# Patient Record
Sex: Female | Born: 1940 | Race: White | Hispanic: No | Marital: Married | Smoking: Former smoker
Health system: Southern US, Community
[De-identification: ages and names within clinical notes are randomized; demographics above are authoritative.]

## PROBLEM LIST (undated history)

## (undated) DIAGNOSIS — E785 Hyperlipidemia, unspecified: Secondary | ICD-10-CM

## (undated) DIAGNOSIS — Z9289 Personal history of other medical treatment: Secondary | ICD-10-CM

## (undated) DIAGNOSIS — I519 Heart disease, unspecified: Secondary | ICD-10-CM

## (undated) DIAGNOSIS — I359 Nonrheumatic aortic valve disorder, unspecified: Secondary | ICD-10-CM

## (undated) DIAGNOSIS — Z0000 Encounter for general adult medical examination without abnormal findings: Secondary | ICD-10-CM

## (undated) DIAGNOSIS — G459 Transient cerebral ischemic attack, unspecified: Principal | ICD-10-CM

## (undated) DIAGNOSIS — J449 Chronic obstructive pulmonary disease, unspecified: Secondary | ICD-10-CM

## (undated) DIAGNOSIS — I10 Essential (primary) hypertension: Secondary | ICD-10-CM

## (undated) DIAGNOSIS — R001 Bradycardia, unspecified: Secondary | ICD-10-CM

## (undated) DIAGNOSIS — I480 Paroxysmal atrial fibrillation: Secondary | ICD-10-CM

## (undated) DIAGNOSIS — H269 Unspecified cataract: Secondary | ICD-10-CM

## (undated) DIAGNOSIS — D649 Anemia, unspecified: Secondary | ICD-10-CM

## (undated) DIAGNOSIS — H3530 Unspecified macular degeneration: Secondary | ICD-10-CM

## (undated) DIAGNOSIS — M545 Low back pain: Secondary | ICD-10-CM

## (undated) DIAGNOSIS — J019 Acute sinusitis, unspecified: Secondary | ICD-10-CM

## (undated) DIAGNOSIS — M25552 Pain in left hip: Secondary | ICD-10-CM

## (undated) DIAGNOSIS — Z7901 Long term (current) use of anticoagulants: Secondary | ICD-10-CM

## (undated) DIAGNOSIS — R1013 Epigastric pain: Secondary | ICD-10-CM

## (undated) DIAGNOSIS — G5760 Lesion of plantar nerve, unspecified lower limb: Secondary | ICD-10-CM

## (undated) DIAGNOSIS — R002 Palpitations: Secondary | ICD-10-CM

## (undated) HISTORY — DX: Epigastric pain: R10.13

## (undated) HISTORY — DX: Palpitations: R00.2

## (undated) HISTORY — DX: Nonrheumatic aortic valve disorder, unspecified: I35.9

## (undated) HISTORY — DX: Pain in left hip: M25.552

## (undated) HISTORY — DX: Encounter for general adult medical examination without abnormal findings: Z00.00

## (undated) HISTORY — DX: Bradycardia, unspecified: R00.1

## (undated) HISTORY — DX: Personal history of other medical treatment: Z92.89

## (undated) HISTORY — DX: Paroxysmal atrial fibrillation: I48.0

## (undated) HISTORY — DX: Essential (primary) hypertension: I10

## (undated) HISTORY — PX: ECTOPIC PREGNANCY SURGERY: SHX613

## (undated) HISTORY — PX: OOPHORECTOMY: SHX86

## (undated) HISTORY — DX: Hyperlipidemia, unspecified: E78.5

## (undated) HISTORY — PX: APPENDECTOMY: SHX54

## (undated) HISTORY — DX: Unspecified macular degeneration: H35.30

## (undated) HISTORY — DX: Lesion of plantar nerve, unspecified lower limb: G57.60

## (undated) HISTORY — DX: Chronic obstructive pulmonary disease, unspecified: J44.9

## (undated) HISTORY — DX: Transient cerebral ischemic attack, unspecified: G45.9

## (undated) HISTORY — DX: Long term (current) use of anticoagulants: Z79.01

## (undated) HISTORY — DX: Low back pain: M54.5

## (undated) HISTORY — DX: Anemia, unspecified: D64.9

## (undated) HISTORY — DX: Acute sinusitis, unspecified: J01.90

## (undated) HISTORY — DX: Unspecified cataract: H26.9

## (undated) HISTORY — DX: Heart disease, unspecified: I51.9

---

## 1998-11-01 ENCOUNTER — Emergency Department (HOSPITAL_COMMUNITY): Admission: EM | Admit: 1998-11-01 | Discharge: 1998-11-01 | Payer: Self-pay | Admitting: Emergency Medicine

## 1999-12-14 ENCOUNTER — Emergency Department (HOSPITAL_COMMUNITY): Admission: EM | Admit: 1999-12-14 | Discharge: 1999-12-15 | Payer: Self-pay | Admitting: *Deleted

## 2001-12-02 ENCOUNTER — Emergency Department (HOSPITAL_COMMUNITY): Admission: EM | Admit: 2001-12-02 | Discharge: 2001-12-02 | Payer: Self-pay | Admitting: Emergency Medicine

## 2005-06-22 ENCOUNTER — Emergency Department (HOSPITAL_COMMUNITY): Admission: EM | Admit: 2005-06-22 | Discharge: 2005-06-23 | Payer: Self-pay | Admitting: Emergency Medicine

## 2006-12-04 ENCOUNTER — Emergency Department (HOSPITAL_COMMUNITY): Admission: EM | Admit: 2006-12-04 | Discharge: 2006-12-04 | Payer: Self-pay | Admitting: Emergency Medicine

## 2006-12-11 ENCOUNTER — Emergency Department (HOSPITAL_COMMUNITY): Admission: EM | Admit: 2006-12-11 | Discharge: 2006-12-11 | Payer: Self-pay | Admitting: Family Medicine

## 2007-12-21 ENCOUNTER — Emergency Department (HOSPITAL_COMMUNITY): Admission: EM | Admit: 2007-12-21 | Discharge: 2007-12-21 | Payer: Self-pay | Admitting: Family Medicine

## 2008-11-24 ENCOUNTER — Emergency Department (HOSPITAL_COMMUNITY): Admission: EM | Admit: 2008-11-24 | Discharge: 2008-11-24 | Payer: Self-pay | Admitting: Family Medicine

## 2009-01-21 ENCOUNTER — Emergency Department (HOSPITAL_COMMUNITY): Admission: EM | Admit: 2009-01-21 | Discharge: 2009-01-21 | Payer: Self-pay | Admitting: Emergency Medicine

## 2009-01-26 DIAGNOSIS — Z9289 Personal history of other medical treatment: Secondary | ICD-10-CM

## 2009-01-26 HISTORY — DX: Personal history of other medical treatment: Z92.89

## 2010-01-16 ENCOUNTER — Ambulatory Visit: Payer: Self-pay | Admitting: Family Medicine

## 2010-01-16 DIAGNOSIS — Z8619 Personal history of other infectious and parasitic diseases: Secondary | ICD-10-CM | POA: Insufficient documentation

## 2010-01-16 DIAGNOSIS — E782 Mixed hyperlipidemia: Secondary | ICD-10-CM

## 2010-01-16 DIAGNOSIS — Z9189 Other specified personal risk factors, not elsewhere classified: Secondary | ICD-10-CM | POA: Insufficient documentation

## 2010-01-16 DIAGNOSIS — E663 Overweight: Secondary | ICD-10-CM | POA: Insufficient documentation

## 2010-01-16 DIAGNOSIS — I4891 Unspecified atrial fibrillation: Secondary | ICD-10-CM | POA: Insufficient documentation

## 2010-01-19 ENCOUNTER — Ambulatory Visit: Payer: Self-pay | Admitting: Family Medicine

## 2010-01-20 ENCOUNTER — Ambulatory Visit: Payer: Self-pay | Admitting: Cardiovascular Disease

## 2010-01-22 ENCOUNTER — Emergency Department (HOSPITAL_COMMUNITY): Admission: EM | Admit: 2010-01-22 | Discharge: 2010-01-22 | Payer: Self-pay | Admitting: Emergency Medicine

## 2010-01-27 ENCOUNTER — Ambulatory Visit: Payer: Self-pay | Admitting: Cardiology

## 2010-02-07 ENCOUNTER — Ambulatory Visit: Payer: Self-pay | Admitting: Cardiology

## 2010-03-08 ENCOUNTER — Ambulatory Visit: Payer: Self-pay | Admitting: Cardiology

## 2010-04-19 ENCOUNTER — Ambulatory Visit: Payer: Self-pay | Admitting: Cardiology

## 2010-04-21 ENCOUNTER — Ambulatory Visit: Payer: Self-pay | Admitting: Cardiology

## 2010-06-06 NOTE — Assessment & Plan Note (Signed)
Summary: AF/heart racing?dm   Vital Signs:  Patient profile:   70 year old female Weight:      185 pounds Temp:     986 degrees F oral BP sitting:   140 / 80  (left arm) Cuff size:   regular  Vitals Entered By: Sid Falcon LPN (January 19, 2010 3:01 PM)  History of Present Illness: Patient is seen as a work in with sensation of irregularity of heart rate over the past several days. She has history of known intermittent atrial fibrillation. She takes metoprolol regularly. She's noticed that her symptoms seem to be worse after eating. Earlier today after she had some grapefruit juice and had some epigastric burning and that seemed to trigger symptoms. She exercises regularly and walked briskly for 2 miles earlier today without any problem. No recent chest pain. No significant caffeine use.  NO RUQ pain. She has been followed by cardiologist for her intermittent A fib.  Symptoms seem to be relieved with burping and Improved with walking and movement. Patient has no history of coronary artery disease. Nonsmoker.  Allergies (verified): No Known Drug Allergies  Past History:  Past Surgical History: Last updated: 24-Jan-2010 h/o tubal pregnancies b/l, both tubes excised and 1 ovary excised Appendectomy  Family History: Last updated: 01/24/10 Father: deceased@81 , bladder cancer Mother: deceased@77 , RA, acute renal failure, HTN Siblings:  Brother: 48, cardiac dysrthymia, severe sleep apnea Sister: deceased@38 , breast cancer, hematologic complication of chemo MGM: unknown hx for all, older age MGF: PGM: PGF: Children: Daughter: 7, A&W  Social History: Last updated: 2010/01/24 Uses Seat Belt Occupation: worked at [employer] Retired Married 52 years Former Smoker quit over 30 years ago, never a heavy smoker Alcohol use-no, stopped w/afib dx, never heavy or frequent Drug use-no Regular exercise-yes, [facility] and [facility] Diet, no restrictions, minimizes dairy  Risk  Factors: Exercise: yes (2010/01/24)  Risk Factors: Smoking Status: quit (2010/01/24)  Past Medical History: A fib PMH-FH-SH reviewed for relevance  Review of Systems  The patient denies anorexia, fever, weight loss, chest pain, syncope, dyspnea on exertion, peripheral edema, prolonged cough, headaches, hemoptysis, melena, and hematochezia.    Physical Exam  General:  Well-developed,well-nourished,in no acute distress; alert,appropriate and cooperative throughout examination Mouth:  Oral mucosa and oropharynx without lesions or exudates.  Teeth in good repair. Neck:  No deformities, masses, or tenderness noted. Lungs:  Normal respiratory effort, chest expands symmetrically. Lungs are clear to auscultation, no crackles or wheezes. Heart:  Normal rate and regular rhythm. S1 and S2 normal without gallop, murmur, click, rub or other extra sounds. Abdomen:  soft, non-tender, normal bowel sounds, no distention, no masses, no guarding, no rigidity, no hepatomegaly, and no splenomegaly.   Neurologic:  no edema   Impression & Recommendations:  Problem # 1:  ATRIAL FIBRILLATION (ICD-427.31) EKG sinus rhythm.  Cont metoprolol. Her updated medication list for this problem includes:    Metoprolol Tartrate 50 Mg Tabs (Metoprolol tartrate) .Marland Kitchen... As needed    Metoprolol Succinate 50 Mg Xr24h-tab (Metoprolol succinate) ..... Once daily    Aspirin 81 Mg Tbec (Aspirin) ..... Once daily  Problem # 2:  ABDOMINAL PAIN, EPIGASTRIC (ICD-789.06)  suspect related to some reflux. Samples of AcipHex 20 mg one daily. Consider Holter monitor if symptoms persist Avoid acidic and other aggravating foods.  Orders: EKG w/ Interpretation (93000)  Complete Medication List: 1)  Metoprolol Tartrate 50 Mg Tabs (Metoprolol tartrate) .... As needed 2)  Metoprolol Succinate 50 Mg Xr24h-tab (Metoprolol succinate) .... Once  daily 3)  Aspirin 81 Mg Tbec (Aspirin) .... Once daily 4)  Calcium  .... Once daily 5)   Multivitamin  .... Once daily  Patient Instructions: 1)  Start Aciphex 20 mg one by mouth daily. 2)  Be in touch with Dr Rogelia Rohrer if symptoms persist into next week.

## 2010-06-06 NOTE — Assessment & Plan Note (Signed)
Summary: BRAND NEW PT/TO EST/CJR   Vital Signs:  Patient profile:   70 year old female Height:      65 inches (165.10 cm) Weight:      185 pounds (84.09 kg) BMI:     30.90 O2 Sat:      98 % on Room air Temp:     98.3 degrees F (36.83 degrees C) oral Pulse rate:   56 / minute BP sitting:   128 / 86  (left arm) Cuff size:   regular  Vitals Entered By: Josph Macho RMA (January 16, 2010 9:18 AM)  O2 Flow:  Room air CC: Establish new patient/ CF Is Patient Diabetic? No   History of Present Illness: Patient in today for new patient appointment doing very well. No recent illness/f/c/malaise/CP/palp/SOB/GU c/o. Has not had a PMD in past sees Dr Hyacinth Meeker of OB/GYN and Dr Cassell Clement of cardiology does her labs q 6 months and manages her intermittent Afib. She has been symptomatic in past but not recently.  Preventive Screening-Counseling & Management  Alcohol-Tobacco     Smoking Status: quit  Caffeine-Diet-Exercise     Does Patient Exercise: yes      Drug Use:  no.    Current Medications (verified): 1)  Metoprolol Tartrate 50 Mg Tabs (Metoprolol Tartrate) .... As Needed 2)  Metoprolol Succinate 50 Mg Xr24h-Tab (Metoprolol Succinate) .... Once Daily 3)  Aspirin 81 Mg Tbec (Aspirin) .... Once Daily 4)  Calcium .... Once Daily 5)  Multivitamin .... 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 52 years Former Smoker quit over 30 years ago,  never a heavy smoker Alcohol use-no, stopped w/afib dx, never heavy or frequent Drug use-no Regular exercise-yes, [facility] and [facility] Diet, no restrictions, minimizes dairy Occupation:  employed Smoking Status:  quit Drug Use:  no Does Patient Exercise:  yes  Review of Systems  The patient denies anorexia, fever, weight loss, weight gain, vision loss, decreased hearing, hoarseness, chest pain, syncope, dyspnea on exertion, peripheral edema, prolonged cough, headaches, hemoptysis, abdominal pain, melena, hematochezia, severe indigestion/heartburn, hematuria, incontinence, muscle weakness, suspicious skin lesions, transient blindness, difficulty walking, depression, unusual weight change, abnormal bleeding, and enlarged lymph nodes.    Physical Exam  General:  Well-developed,well-nourished,in no acute distress; alert,appropriate and cooperative throughout examination Head:  Normocephalic and atraumatic without obvious abnormalities. No apparent alopecia or balding. Eyes:  No corneal or conjunctival inflammation noted. EOMI.  Ears:  External ear exam shows no significant lesions or deformities.  Otoscopic examination reveals clear canals, tympanic membranes are intact bilaterally without bulging, retraction, inflammation or discharge. Hearing is grossly normal bilaterally. Nose:  External nasal examination shows no deformity or inflammation. Nasal mucosa are pink and moist without lesions or exudates. Mouth:  Oral mucosa and oropharynx without lesions or exudates.  Teeth in good repair. Neck:  No deformities, masses, or tenderness noted. Lungs:  Normal respiratory effort, chest expands symmetrically. Lungs are clear to auscultation, no crackles or wheezes. Heart:  Normal regular rhythm. S1 and S2 normal without gallop ,  click, rub or other extra sounds.grade  2/6 systolic murmur.  Mild bradycardia Abdomen:  Bowel sounds positive,abdomen soft and non-tender without masses, organomegaly or  hernias noted. Msk:  No deformity or scoliosis noted of thoracic or lumbar spine.   Pulses:  R and L carotid,radial,femoral,dorsalis pedis and posterior tibial pulses are full and equal bilaterally Extremities:  No clubbing, cyanosis, edema, or deformity noted with normal full range of motion of all joints.   Neurologic:  No cranial nerve deficits noted. Station and gait are normal. Plantar reflexes are down-going bilaterally. DTRs are symmetrical throughout. Sensory, motor and coordinative functions appear intact. Skin:  Intact without suspicious lesions or rashes. small scaly scar posterior, upper calf roughly 1 cm, flesh colored Cervical Nodes:  No lymphadenopathy noted Psych:  Cognition and judgment appear intact. Alert and cooperative with normal attention span and concentration. No apparent delusions, illusions, hallucinations   Impression & Recommendations:  Problem # 1:  MIXED HYPERLIPIDEMIA (ICD-272.2) Mild, avoid trans fats. Add Benefiber or Metamucil daily and cont to check labs and have them forwarded q 6 months.  Problem # 2:  OVERWEIGHT (ICD-278.02) Continue exercise regimen and monitor  Problem # 3:  ATRIAL FIBRILLATION (ICD-427.31)  Her updated medication list for this problem includes:    Metoprolol Tartrate 50 Mg Tabs (Metoprolol tartrate) .Marland Kitchen... As needed    Metoprolol Succinate 50 Mg Xr24h-tab (Metoprolol succinate) ..... Once daily    Aspirin 81 Mg Tbec (Aspirin) ..... Once daily Intermittent, f/u with Dr Patty Sermons  Problem # 4:  Preventive Health Care (ICD-V70.0) Declines referral to GI for colonoscopy, she says she will set it up herself with Dr Ewing Schlein at [facility] She also declines flu shot says she will get it at [facility] in October  Complete Medication List: 1)  Metoprolol Tartrate 50 Mg Tabs (Metoprolol tartrate) .... As needed 2)  Metoprolol Succinate 50 Mg Xr24h-tab (Metoprolol succinate) .... Once daily 3)  Aspirin 81 Mg Tbec (Aspirin) .... Once  daily 4)  Calcium  .... Once daily 5)  Multivitamin  .... Once daily  Patient Instructions: 1)  Please schedule a follow-up appointment in 1 year for annual exam 2)  Schedule yourself a screening Colonoscopy with Dr Ewing Schlein 3)  Add Benefiber 2 tsp by mouth once daily in 6-8 oz of fluids daily  Preventive Care Screening  Hemoccult:    Date:  10/27/2009    Results:  historical   Pap Smear:    Date:  10/27/2009    Results:  historical   Mammogram:    Date:  07/05/2009    Results:  historical   Last Flu Shot:    Date:  02/04/2009    Results:  historicall   Last Pneumovax:    Date:  05/08/2007    Results:  historical   Last Tetanus Booster:    Date:  05/08/2007    Results:  Historical

## 2010-07-20 LAB — POCT I-STAT, CHEM 8
BUN: 12 mg/dL (ref 6–23)
Chloride: 107 mEq/L (ref 96–112)
Creatinine, Ser: 1.1 mg/dL (ref 0.4–1.2)
Sodium: 141 mEq/L (ref 135–145)
TCO2: 25 mmol/L (ref 0–100)

## 2010-07-20 LAB — POCT CARDIAC MARKERS
CKMB, poc: <1 ng/mL — ABNORMAL LOW (ref 1.0–8.0)
Myoglobin, poc: 74.3 ng/mL (ref 12–200)
Troponin i, poc: <0.05 ng/mL (ref 0.00–0.09)

## 2010-07-27 ENCOUNTER — Other Ambulatory Visit: Payer: Self-pay | Admitting: *Deleted

## 2010-07-27 DIAGNOSIS — K30 Functional dyspepsia: Secondary | ICD-10-CM

## 2010-07-27 MED ORDER — OMEPRAZOLE 40 MG PO CPDR: 40.0000 mg | DELAYED_RELEASE_CAPSULE | Freq: Every day | ORAL | Status: DC

## 2010-07-27 NOTE — Telephone Encounter (Signed)
Refilled meds per fax request.  

## 2010-07-31 ENCOUNTER — Encounter: Payer: Self-pay | Admitting: *Deleted

## 2010-07-31 NOTE — Telephone Encounter (Signed)
Prescription requested via pharmacy fax again, opened encounter and refill already done.

## 2010-08-02 ENCOUNTER — Encounter: Payer: Self-pay | Admitting: Family Medicine

## 2010-08-11 LAB — BASIC METABOLIC PANEL
BUN: 12 mg/dL (ref 6–23)
CO2: 28 mEq/L (ref 19–32)
Calcium: 9.4 mg/dL (ref 8.4–10.5)
GFR calc non Af Amer: >60 mL/min (ref >60)
Glucose, Bld: 105 mg/dL — ABNORMAL HIGH (ref 70–99)
Sodium: 143 mEq/L (ref 135–145)

## 2010-08-11 LAB — CBC
HCT: 39.8 % (ref 36.0–46.0)
Hemoglobin: 13.5 g/dL (ref 12.0–15.0)
MCHC: 33.8 g/dL (ref 30.0–36.0)
Platelets: 256 10*3/uL (ref 150–400)
RDW: 13.1 % (ref 11.5–15.5)

## 2010-08-11 LAB — URINALYSIS, ROUTINE W REFLEX MICROSCOPIC
Glucose, UA: NEGATIVE mg/dL
Ketones, ur: NEGATIVE mg/dL
Nitrite: NEGATIVE
Protein, ur: NEGATIVE mg/dL
Urobilinogen, UA: 0.2 mg/dL (ref 0.0–1.0)

## 2010-08-11 LAB — DIFFERENTIAL
Basophils Absolute: 0 10*3/uL (ref 0.0–0.1)
Basophils Relative: 1 % (ref 0–1)
Eosinophils Relative: 3 % (ref 0–5)
Monocytes Absolute: 0.6 10*3/uL (ref 0.1–1.0)

## 2010-08-11 LAB — URINE CULTURE: Colony Count: 10000

## 2010-08-11 LAB — URINE MICROSCOPIC-ADD ON

## 2010-08-11 LAB — POCT CARDIAC MARKERS

## 2010-08-13 LAB — URINE CULTURE

## 2010-08-13 LAB — POCT URINALYSIS DIP (DEVICE)
Ketones, ur: NEGATIVE mg/dL
Protein, ur: NEGATIVE mg/dL
Specific Gravity, Urine: 1.01 (ref 1.005–1.030)
Urobilinogen, UA: 0.2 mg/dL (ref 0.0–1.0)
pH: 7 (ref 5.0–8.0)

## 2010-10-16 ENCOUNTER — Other Ambulatory Visit: Payer: Self-pay | Admitting: *Deleted

## 2010-10-16 DIAGNOSIS — E785 Hyperlipidemia, unspecified: Secondary | ICD-10-CM

## 2010-10-17 ENCOUNTER — Encounter: Payer: Self-pay | Admitting: *Deleted

## 2010-10-17 ENCOUNTER — Other Ambulatory Visit: Payer: Self-pay | Admitting: Cardiology

## 2010-10-17 ENCOUNTER — Other Ambulatory Visit (INDEPENDENT_AMBULATORY_CARE_PROVIDER_SITE_OTHER): Payer: Medicare Other | Admitting: *Deleted

## 2010-10-17 DIAGNOSIS — E785 Hyperlipidemia, unspecified: Secondary | ICD-10-CM

## 2010-10-17 LAB — HEPATIC FUNCTION PANEL
AST: 25 U/L (ref 0–37)
Alkaline Phosphatase: 85 U/L (ref 39–117)
Bilirubin, Direct: 0.1 mg/dL (ref 0.0–0.3)
Total Bilirubin: 0.6 mg/dL (ref 0.3–1.2)

## 2010-10-17 LAB — LIPID PANEL
Cholesterol: 225 mg/dL — ABNORMAL HIGH (ref 0–200)
Total CHOL/HDL Ratio: 4
VLDL: 13.4 mg/dL (ref 0.0–40.0)

## 2010-10-17 LAB — LDL CHOLESTEROL, DIRECT: Direct LDL: 156.6 mg/dL

## 2010-10-17 LAB — BASIC METABOLIC PANEL
BUN: 14 mg/dL (ref 6–23)
GFR: 58.55 mL/min — ABNORMAL LOW (ref >60.00)
Potassium: 4.2 mEq/L (ref 3.5–5.1)
Sodium: 139 mEq/L (ref 135–145)

## 2010-10-18 ENCOUNTER — Encounter: Payer: Self-pay | Admitting: Cardiology

## 2010-10-18 ENCOUNTER — Ambulatory Visit (INDEPENDENT_AMBULATORY_CARE_PROVIDER_SITE_OTHER): Payer: Medicare Other | Admitting: Cardiology

## 2010-10-18 VITALS — BP 130/72 | HR 56 | Wt 176.0 lb

## 2010-10-18 DIAGNOSIS — I4891 Unspecified atrial fibrillation: Secondary | ICD-10-CM

## 2010-10-18 DIAGNOSIS — E782 Mixed hyperlipidemia: Secondary | ICD-10-CM

## 2010-10-18 DIAGNOSIS — E7800 Pure hypercholesterolemia, unspecified: Secondary | ICD-10-CM

## 2010-10-18 DIAGNOSIS — Z79899 Other long term (current) drug therapy: Secondary | ICD-10-CM

## 2010-10-18 MED ORDER — METOPROLOL TARTRATE 50 MG PO TABS: 50.0000 mg | ORAL_TABLET | ORAL | Status: DC | PRN

## 2010-10-18 MED ORDER — SIMVASTATIN 20 MG PO TABS: 20.0000 mg | ORAL_TABLET | Freq: Every evening | ORAL | Status: DC

## 2010-10-18 NOTE — Assessment & Plan Note (Signed)
The patient has a past history of paroxysmal atrial fibrillation.  Since the addition of a small dose of Lanoxin 0.125 mg daily the patient has had fewer episodes of atrial fib.  Number the episodes last for a long period she has not had to be on long-term Coumadin.  She is taking a daily aspirin.  She does not have any history of ischemic heart disease.

## 2010-10-18 NOTE — Assessment & Plan Note (Signed)
The patient has a past history of hyperlipidemia.  She has not been able to bring her cholesterol down with diet and exercise.  Her LDL today is156.The patient is now willing to try statin drugs which she has been unwilling to previously Try.  Cost is a factor so we will start with simvastatin 20 mg daily

## 2010-10-18 NOTE — Progress Notes (Signed)
Dagoberto Reef Date of Birth:  Sep 16, 1940 [facility] / [facility] [address]   [address] [place], [place]  [address] [phone]           Fax   [phone]  History of Present Illness: This pleasant middle-aged Caucasian female is seen for a followup office visit.  She has a history of paroxysmal atrial fibrillation and history of hypercholesterolemia.  She has not had any sustained atrial fibrillation since last visit.  Her symptoms of atrial fibrillation have decreased since starting Lanoxin and continuing Toprol.  He does not have any history of ischemic heart disease.  She does have a history of high cholesterol and her LDL this time he is 156 the patient has a history of a echocardiogram on 01/26/09 showing normal left ventricular systolic function with an ejection fraction of 55-60%, normal diastolic function, mild aortic sclerosis with mild aortic insufficiency, and mild mitral regurgitation.  Current Outpatient Prescriptions  Medication Sig Dispense Refill  . aspirin 81 MG tablet Take 81 mg by mouth daily.        Marland Kitchen CALCIUM PO Take 1 tablet by mouth daily.        . digoxin (LANOXIN) 0.125 MG tablet Take 125 mcg by mouth daily.        . metoprolol (LOPRESSOR) 50 MG tablet Take 1 tablet (50 mg total) by mouth as needed.  30 tablet  12  . metoprolol (TOPROL-XL) 50 MG 24 hr tablet Take 50 mg by mouth daily.        . multivitamin (THERAGRAN) per tablet Take 1 tablet by mouth daily.        Marland Kitchen omeprazole (PRILOSEC) 40 MG capsule Take 1 capsule (40 mg total) by mouth daily.  30 capsule  11  . DISCONTD: metoprolol (LOPRESSOR) 50 MG tablet Take 50 mg by mouth as needed.        . simvastatin (ZOCOR) 20 MG tablet Take 1 tablet (20 mg total) by mouth every evening.  30 tablet  11    No Known Allergies  Patient Active Problem List  Diagnoses  . Mixed hyperlipidemia  . OVERWEIGHT  . Atrial fibrillation  . MEASLES, HX OF  . CHICKENPOX, HX OF    History  Smoking  status  . Former Smoker -- 0.5 packs/day for 25 years  . Types: Cigarettes  . Quit date: 06/22/2005  Smokeless tobacco  . Not on file    History  Alcohol Use No    Family History  Problem Relation Age of Onset  . Kidney failure Mother   . Hypertension Mother   . Stroke Mother   . Cancer Father     bladder cancer  . Atrial fibrillation Brother   . Sleep apnea Brother   . Hypertension Brother   . Breast cancer Sister     Review of Systems: Constitutional: no fever chills diaphoresis or fatigue or change in weight.  Head and neck: no hearing loss, no epistaxis, no photophobia or visual disturbance. Respiratory: No cough, shortness of breath or wheezing. Cardiovascular: No chest pain peripheral edema, palpitations. Gastrointestinal: No abdominal distention, no abdominal pain, no change in bowel habits hematochezia or melena. Genitourinary: No dysuria, no frequency, no urgency, no nocturia. Musculoskeletal:No arthralgias, no back pain, no gait disturbance or myalgias. Neurological: No dizziness, no headaches, no numbness, no seizures, no syncope, no weakness, no tremors. Hematologic: No lymphadenopathy, no easy bruising. Psychiatric: No confusion, no hallucinations, no sleep disturbance.    Physical Exam: Filed Vitals:  10/18/10 0945  BP: 130/72  Pulse: 56  The general appearance feels a well-developed well-nourished woman in no distress.The head and neck exam reveals pupils equal and reactive.  Extraocular movements are full.  There is no scleral icterus.  The mouth and pharynx are normal.  The neck is supple.  The carotids reveal no bruits.  The jugular venous pressure is normal.  The  thyroid is not enlarged.  There is no lymphadenopathy.  The chest is clear to percussion and auscultation.  There are no rales or rhonchi.  Expansion of the chest is symmetrical.  The precordium is quiet.  The first heart sound is normal.  The second heart sound is physiologically split.   There is no  gallop rub or click.There is a soft grade 1/6 systolic ejection murmur at the base.  There is no abnormal lift or heave.  The abdomen is soft and nontender.  The bowel sounds are normal.  The liver and spleen are not enlarged.  There are no abdominal masses.  There are no abdominal bruits.  Extremities reveal good pedal pulses.  There is no phlebitis or edema.  There is no cyanosis or clubbing.  Strength is normal and symmetrical in all extremities.  There is no lateralizing weakness.  There are no sensory deficits.  The skin is warm and dry.  There is no rash.   Assessment / Plan: Hypercholesterolemia.  She is to continue a low cholesterol diet and we're adding simvastatin 20 mg daily in the evening.  She will return in 2 months for fasting lab work alone and in 6 months for an office visit and fasting lab work

## 2010-12-27 ENCOUNTER — Other Ambulatory Visit (INDEPENDENT_AMBULATORY_CARE_PROVIDER_SITE_OTHER): Payer: Medicare Other | Admitting: *Deleted

## 2010-12-27 ENCOUNTER — Other Ambulatory Visit: Payer: Medicare Other | Admitting: *Deleted

## 2010-12-27 DIAGNOSIS — Z79899 Other long term (current) drug therapy: Secondary | ICD-10-CM

## 2010-12-27 DIAGNOSIS — E7800 Pure hypercholesterolemia, unspecified: Secondary | ICD-10-CM

## 2010-12-27 LAB — LIPID PANEL
Total CHOL/HDL Ratio: 3
Triglycerides: 66 mg/dL (ref 0.0–149.0)

## 2010-12-28 ENCOUNTER — Telehealth: Payer: Self-pay | Admitting: *Deleted

## 2010-12-28 NOTE — Telephone Encounter (Signed)
Advised of labs 

## 2010-12-28 NOTE — Telephone Encounter (Signed)
Message copied by Eugenia Pancoast on Thu Dec 28, 2010 10:18 AM ------      Message from: Cassell Clement      Created: Thu Dec 28, 2010  9:00 AM       Lipids are much better.      CSD

## 2011-01-24 ENCOUNTER — Other Ambulatory Visit: Payer: Self-pay | Admitting: Cardiology

## 2011-01-24 DIAGNOSIS — I4891 Unspecified atrial fibrillation: Secondary | ICD-10-CM

## 2011-03-13 ENCOUNTER — Other Ambulatory Visit: Payer: Self-pay | Admitting: Cardiology

## 2011-03-13 MED ORDER — METOPROLOL SUCCINATE ER 50 MG PO TB24: 50.0000 mg | ORAL_TABLET | Freq: Every day | ORAL | Status: DC

## 2011-04-10 ENCOUNTER — Ambulatory Visit (INDEPENDENT_AMBULATORY_CARE_PROVIDER_SITE_OTHER): Payer: Medicare Other | Admitting: Family Medicine

## 2011-04-10 ENCOUNTER — Encounter: Payer: Self-pay | Admitting: Family Medicine

## 2011-04-10 VITALS — BP 131/73 | HR 62 | Temp 98.0°F | Ht 65.0 in | Wt 179.0 lb

## 2011-04-10 DIAGNOSIS — J329 Chronic sinusitis, unspecified: Secondary | ICD-10-CM

## 2011-04-10 DIAGNOSIS — J019 Acute sinusitis, unspecified: Secondary | ICD-10-CM

## 2011-04-10 DIAGNOSIS — R05 Cough: Secondary | ICD-10-CM

## 2011-04-10 HISTORY — DX: Acute sinusitis, unspecified: J01.90

## 2011-04-10 MED ORDER — AZITHROMYCIN 250 MG PO TABS: ORAL_TABLET | ORAL | Status: AC

## 2011-04-10 MED ORDER — GUAIFENESIN ER 600 MG PO TB12: 600.0000 mg | ORAL_TABLET | Freq: Two times a day (BID) | ORAL | Status: DC

## 2011-04-10 MED ORDER — CHLORPHENIRAMINE-HYDROCODONE 8-10 MG/5ML PO LQCR: 5.0000 mL | Freq: Two times a day (BID) | ORAL | Status: DC | PRN

## 2011-04-10 NOTE — Progress Notes (Signed)
Autumn Moss [id number] [birth year] 04/10/2011      Progress Note-Follow Up  Subjective  Chief Complaint  Chief Complaint  Patient presents with  . Cough    x 1 week, temp 99.6 Saturday,    HPI  Patient is a 70 year old Caucasian female who is in today with a roughly one-week history of worsening headache and cough. Her cough is productive of thick green phlegm at times. She has significant nasal congestion or rhinorrhea, sore throat, dysphasia and some hoarseness. She notes her husband recent similar symptoms was treated with antibiotics and did improve somewhat. No ear pain, chest pain, palpitations, shortness of breath  Past Medical History  Diagnosis Date  . Hypertension   . Paroxysmal atrial fibrillation   . Palpitations     sporadic  . Hyperlipidemia   . Aortic valve disease     mild - 07/05/05 per echocardiogram   . Left ventricular diastolic dysfunction     mild - 07/05/05 per echocardiogram  . COPD (chronic obstructive pulmonary disease)   . Ectopic pregnancy     history of x2  . Dyspepsia     occasional  . History of echocardiogram 01/26/2009    Est. EF of 55-60%  --  MILD MITRAL REGURGITATION  -  SINCE LAST ECHO OF 07/10/05 NO SIGNIFICANT CHANGES.  ---  Clovis Pu Patty Sermons, MD    Past Surgical History  Procedure Date  . Ectopic pregnancy surgery     bilateral, both tubes excised and 1 ovary excised  . Oophorectomy   . Appendectomy     Family History  Problem Relation Age of Onset  . Kidney failure Mother   . Hypertension Mother   . Stroke Mother   . Arthritis Mother     RA  . Cancer Father     bladder cancer  . Atrial fibrillation Brother   . Sleep apnea Brother   . Hypertension Brother   . Breast cancer Sister     History   Social History  . Marital Status: Married    Spouse Name: Merlyn Albert    Number of Children: 1  . Years of Education: N/A   Occupational History  .     Social History Main Topics  . Smoking status: Former Smoker -- 0.5  packs/day for 25 years    Types: Cigarettes    Quit date: 06/22/2005  . Smokeless tobacco: Never Used  . Alcohol Use: No  . Drug Use: No  . Sexually Active: Not on file   Other Topics Concern  . Not on file   Social History Narrative   Uses seat beltWorked at [employer], [employer] exercise: yes, goes to [facility] daily and walksDiet, no restrictions, minimizes dairy    Current Outpatient Prescriptions on File Prior to Visit  Medication Sig Dispense Refill  . aspirin 81 MG tablet Take 81 mg by mouth daily.        Marland Kitchen CALCIUM PO Take 1 tablet by mouth daily.        . digoxin (LANOXIN) 0.125 MG tablet TAKE 1 TABLET EVERY DAY  30 tablet  11  . metoprolol (LOPRESSOR) 50 MG tablet Take 1 tablet (50 mg total) by mouth as needed.  30 tablet  12  . metoprolol (TOPROL-XL) 50 MG 24 hr tablet Take 1 tablet (50 mg total) by mouth daily.  30 tablet  2  . multivitamin (THERAGRAN) per tablet Take 1 tablet by mouth daily.        Marland Kitchen omeprazole (PRILOSEC) 40 MG  capsule Take 1 capsule (40 mg total) by mouth daily.  30 capsule  11  . simvastatin (ZOCOR) 20 MG tablet Take 1 tablet (20 mg total) by mouth every evening.  30 tablet  11    No Known Allergies  Review of Systems  Review of Systems  Constitutional: Positive for fever and malaise/fatigue.  HENT: Positive for congestion and sore throat.   Eyes: Negative for discharge.  Respiratory: Positive for cough, sputum production and shortness of breath.   Cardiovascular: Negative for chest pain, palpitations and leg swelling.  Gastrointestinal: Negative for nausea, abdominal pain and diarrhea.  Genitourinary: Negative for dysuria.  Musculoskeletal: Negative for falls.  Skin: Negative for rash.  Neurological: Negative for loss of consciousness and headaches.  Endo/Heme/Allergies: Negative for polydipsia.  Psychiatric/Behavioral: Negative for depression and suicidal ideas. The patient is not nervous/anxious and does not have insomnia.      Objective  BP  131/73  Pulse 62  Temp(Src) 98 F (36.7 C) (Oral)  Ht 5\' 5"  (1.651 m)  Wt 179 lb (81.194 kg)  BMI 29.79 kg/m2  SpO2 97%  Physical Exam  Physical Exam  Constitutional: She is oriented to person, place, and time and well-developed, well-nourished, and in no distress. No distress.  HENT:  Head: Normocephalic and atraumatic.       Erythema in posterior oropharynx  Eyes: Conjunctivae are normal.  Neck: Neck supple. No thyromegaly present.  Cardiovascular: Normal rate, regular rhythm and normal heart sounds.   No murmur heard. Pulmonary/Chest: Effort normal and breath sounds normal. She has no wheezes.  Abdominal: She exhibits no distension and no mass.  Musculoskeletal: She exhibits no edema.  Lymphadenopathy:    She has no cervical adenopathy.  Neurological: She is alert and oriented to person, place, and time.  Skin: Skin is warm and dry. No rash noted. She is not diaphoretic.  Psychiatric: Memory, affect and judgment normal.    No results found for this basename: TSH   Lab Results  Component Value Date   WBC 5.7 01/21/2009   HGB 14.6 01/22/2010   HCT 43.0 01/22/2010   MCV 97.7 01/21/2009   PLT 256 01/21/2009   Lab Results  Component Value Date   CREATININE 1.0 10/17/2010   BUN 14 10/17/2010   NA 139 10/17/2010   K 4.2 10/17/2010   CL 105 10/17/2010   CO2 28 10/17/2010   Lab Results  Component Value Date   ALT 21 10/17/2010   AST 25 10/17/2010   ALKPHOS 85 10/17/2010   BILITOT 0.6 10/17/2010   Lab Results  Component Value Date   CHOL 150 12/27/2010   Lab Results  Component Value Date   HDL 52.70 12/27/2010   Lab Results  Component Value Date   LDLCALC 84 12/27/2010   Lab Results  Component Value Date   TRIG 66.0 12/27/2010   Lab Results  Component Value Date   CHOLHDL 3 12/27/2010     Assessment & Plan  Sinusitis acute Patient started on Zpack and Mucinex bid, increase fluids and given some Tussionex to help her get some rest

## 2011-04-10 NOTE — Assessment & Plan Note (Signed)
Patient started on Zpack and Mucinex bid, increase fluids and given some Tussionex to help her get some rest

## 2011-04-10 NOTE — Patient Instructions (Addendum)
Sinusitis Sinuses are air pockets within the bones of your face. The growth of bacteria within a sinus leads to infection. The infection prevents the sinuses from draining. This infection is called sinusitis. SYMPTOMS  There will be different areas of pain depending on which sinuses have become infected.  The maxillary sinuses often produce pain beneath the eyes.   Frontal sinusitis may cause pain in the middle of the forehead and above the eyes.  Other problems (symptoms) include:  Toothaches.   Colored, pus-like (purulent) drainage from the nose.   Swelling, warmth, and tenderness over the sinus areas may be signs of infection.  TREATMENT  Sinusitis is most often determined by an exam.X-rays may be taken. If x-rays have been taken, make sure you obtain your results or find out how you are to obtain them. Your caregiver may give you medications (antibiotics). These are medications that will help kill the bacteria causing the infection. You may also be given a medication (decongestant) that helps to reduce sinus swelling.  HOME CARE INSTRUCTIONS   Only take over-the-counter or prescription medicines for pain, discomfort, or fever as directed by your caregiver.   Drink extra fluids. Fluids help thin the mucus so your sinuses can drain more easily.   Applying either moist heat or ice packs to the sinus areas may help relieve discomfort.   Use saline nasal sprays to help moisten your sinuses. The sprays can be found at your local drugstore.  SEEK IMMEDIATE MEDICAL CARE IF:  You have a fever.   You have increasing pain, severe headaches, or toothache.   You have nausea, vomiting, or drowsiness.   You develop unusual swelling around the face or trouble seeing.  MAKE SURE YOU:   Understand these instructions.   Will watch your condition.   Will get help right away if you are not doing well or get worse.  Document Released: 04/23/2005 Document Revised: 01/03/2011 Document Reviewed:  11/20/2006 [facility] Patient Information 2012 [place], [place].  Increase fluids in your diet

## 2011-04-18 ENCOUNTER — Ambulatory Visit (INDEPENDENT_AMBULATORY_CARE_PROVIDER_SITE_OTHER): Payer: Medicare Other | Admitting: Family Medicine

## 2011-04-18 ENCOUNTER — Encounter: Payer: Self-pay | Admitting: Family Medicine

## 2011-04-18 VITALS — BP 145/74 | HR 58 | Temp 98.5°F | Ht 65.0 in | Wt 177.8 lb

## 2011-04-18 DIAGNOSIS — I4891 Unspecified atrial fibrillation: Secondary | ICD-10-CM

## 2011-04-18 DIAGNOSIS — J019 Acute sinusitis, unspecified: Secondary | ICD-10-CM

## 2011-04-18 DIAGNOSIS — J329 Chronic sinusitis, unspecified: Secondary | ICD-10-CM

## 2011-04-18 DIAGNOSIS — G5760 Lesion of plantar nerve, unspecified lower limb: Secondary | ICD-10-CM

## 2011-04-18 HISTORY — DX: Lesion of plantar nerve, unspecified lower limb: G57.60

## 2011-04-18 MED ORDER — AZITHROMYCIN 250 MG PO TABS: ORAL_TABLET | ORAL | Status: AC

## 2011-04-18 NOTE — Patient Instructions (Signed)
Morton's Neuroma Neuralgia (nerve pain) or neuroma (benign [non-cancerous] nerve tumor) may develop on any interdigital nerve. The interdigital nerves (nerves between digits) of the foot travel beneath and between the metatarsals (long bones of the fore foot) and pass the nerve endings to the toes. The third interdigital is a common place for a small neuroma to form called Morton's neuroma. Another nerve to be affected commonly is the fourth interdigital nerve. This would be in approximately in the area of the base or ball under the bottom of your fourth toe. This condition occurs more commonly in women and is usually on one side. It is usually first noticed by pain radiating (spreading) to the ball of the foot or to the toes. CAUSES The cause of interdigital neuralgia may be from low grade repetitive trauma (damage caused by an accident) as in activities causing a repeated pounding of the foot (running, jumping etc.). It is also caused by improper footwear or recent loss of the fatty padding on the bottom of the foot. TREATMENT  The condition often resolves (goes away) simply with decreasing activity if that is thought to be the cause. Proper shoes are beneficial. Orthotics (special foot support aids) such as a metatarsal bar are often beneficial. This condition usually responds to conservative therapy, however if surgery is necessary it usually brings complete relief. HOME CARE INSTRUCTIONS   Apply ice to the area of soreness for 15 to 20 minutes, 3 to 4 times per day, while awake for the first 2 days. Put ice in a plastic bag and place a towel between the bag of ice and your skin.   Only take over-the-counter or prescription medicines for pain, discomfort, or fever as directed by your caregiver.  MAKE SURE YOU:   Understand these instructions.   Will watch your condition.   Will get help right away if you are not doing well or get worse.  Document Released: 07/30/2000 Document Revised:  01/03/2011 Document Reviewed: 04/23/2005 [facility] Patient Information 2012 [place], [place].  Try ice and Aspercreme to your left foot three times daily and get some Dr Margart Sickles gel inserts for your shoes if no improvement consider referral to a foot surgeon for treatment of a likely Neuroma vs Ganglion cyst

## 2011-04-18 NOTE — Assessment & Plan Note (Signed)
In RRR today

## 2011-04-18 NOTE — Assessment & Plan Note (Signed)
Base of left foot. Painful with palpation and weight bearing. Tolerable at present, possible ganglion cyst but more likely a neuroma, recommend ice and aspercreme tid and shoe inserts if no improvement will require referral to surgeon for removal

## 2011-04-18 NOTE — Assessment & Plan Note (Signed)
Improved but is given a refill today secondary to concerns about getting sicker again over the holidays to use as needed. Return if no improvement

## 2011-04-18 NOTE — Progress Notes (Signed)
Autumn Moss [id number] 1940/10/29 04/18/2011      Progress Note-Follow Up  Subjective  Chief Complaint  Chief Complaint  Patient presents with  . Foot Injury    knot on bottom of left foot X 2 days    HPI  This is a 70 year old Caucasian female who is in today for evaluation of a lesion on her left foot. She will have just noted this week and is tender to palpation. She denies any injury. She denies any warmth or heat. It is more tender with palpation and weightbearing. She denies any history of similar lesions. She does note overall her congestion her cough, fatigue she came in with last week her greatly better. She denies any respiratory symptoms, fevers, chills, chest pain, palpitations, shortness of breath GI or GU complaints today.  Past Medical History  Diagnosis Date  . Hypertension   . Paroxysmal atrial fibrillation   . Palpitations     sporadic  . Hyperlipidemia   . Aortic valve disease     mild - 07/05/05 per echocardiogram   . Left ventricular diastolic dysfunction     mild - 07/05/05 per echocardiogram  . COPD (chronic obstructive pulmonary disease)   . Ectopic pregnancy     history of x2  . Dyspepsia     occasional  . History of echocardiogram 01/26/2009    Est. EF of 55-60%  --  MILD MITRAL REGURGITATION  -  SINCE LAST ECHO OF 07/10/05 NO SIGNIFICANT CHANGES.  ---  Clovis Pu Brackbill, MD  . Sinusitis acute 04/10/2011  . 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 Ectopic pregnancy surgery     bilateral, both tubes excised and 1 ovary excised  . Oophorectomy   . Appendectomy     Family History  Problem Relation Age of Onset  . Kidney failure Mother   . Hypertension Mother   . Stroke Mother   . Arthritis Mother     RA  . Cancer Father     bladder cancer  . Atrial fibrillation Brother   . Sleep apnea Brother   . Hypertension Brother   . Breast cancer Sister     History   Social History  . Marital Status:  Married    Spouse Name: Merlyn Albert    Number of Children: 1  . Years of Education: N/A   Occupational History  .     Social History Main Topics  . Smoking status: Former Smoker -- 0.5 packs/day for 25 years    Types: Cigarettes    Quit date: 06/22/2005  . Smokeless tobacco: Never Used  . Alcohol Use: No  . Drug Use: No  . Sexually Active: Not on file   Other Topics Concern  . Not on file   Social History Narrative   Uses seat beltWorked at [employer], [employer] exercise: yes, goes to Y daily and walksDiet, no restrictions, minimizes dairy    Current Outpatient Prescriptions on File Prior to Visit  Medication Sig Dispense Refill  . aspirin 81 MG tablet Take 81 mg by mouth daily.        Marland Kitchen CALCIUM PO Take 1 tablet by mouth daily.        . digoxin (LANOXIN) 0.125 MG tablet TAKE 1 TABLET EVERY DAY  30 tablet  11  . Lutein 6 MG CAPS Take 2 capsules by mouth daily.       . metoprolol (LOPRESSOR) 50 MG tablet Take 1 tablet (50 mg total)  by mouth as needed.  30 tablet  12  . metoprolol (TOPROL-XL) 50 MG 24 hr tablet Take 1 tablet (50 mg total) by mouth daily.  30 tablet  2  . multivitamin (THERAGRAN) per tablet Take 1 tablet by mouth daily.        Marland Kitchen omeprazole (PRILOSEC) 40 MG capsule Take 1 capsule (40 mg total) by mouth daily.  30 capsule  11  . simvastatin (ZOCOR) 20 MG tablet Take 1 tablet (20 mg total) by mouth every evening.  30 tablet  11    No Known Allergies  Review of Systems  Review of Systems  Constitutional: Negative for fever and malaise/fatigue.  HENT: Negative for congestion.   Eyes: Negative for discharge.  Respiratory: Negative for shortness of breath.   Cardiovascular: Negative for chest pain, palpitations and leg swelling.  Gastrointestinal: Negative for nausea, abdominal pain and diarrhea.  Genitourinary: Negative for dysuria.  Musculoskeletal: Negative for falls.       Left foot pain at raised spot on arch, this week  Skin: Negative for rash.  Neurological:  Negative for loss of consciousness and headaches.  Endo/Heme/Allergies: Negative for polydipsia.  Psychiatric/Behavioral: Negative for depression and suicidal ideas. The patient is not nervous/anxious and does not have insomnia.     Objective  BP 145/74  Pulse 58  Temp(Src) 98.5 F (36.9 C) (Oral)  Ht 5\' 5"  (1.651 m)  Wt 177 lb 12.8 oz (80.65 kg)  BMI 29.59 kg/m2  SpO2 97%  Physical Exam  Physical Exam  Constitutional: She is oriented to person, place, and time and well-developed, well-nourished, and in no distress. No distress.  HENT:  Head: Normocephalic and atraumatic.  Eyes: Conjunctivae are normal.  Neck: Neck supple. No thyromegaly present.  Cardiovascular: Normal rate, regular rhythm and normal heart sounds.   No murmur heard. Pulmonary/Chest: Effort normal and breath sounds normal. She has no wheezes.  Abdominal: She exhibits no distension and no mass.  Musculoskeletal: She exhibits tenderness. She exhibits no edema.       1 cm firm, tender raised, round nodule at arch of left foot. No redness or warmth  Lymphadenopathy:    She has no cervical adenopathy.  Neurological: She is alert and oriented to person, place, and time.  Skin: Skin is warm and dry. No rash noted. She is not diaphoretic.  Psychiatric: Memory, affect and judgment normal.    No results found for this basename: TSH   Lab Results  Component Value Date   WBC 5.7 01/21/2009   HGB 14.6 01/22/2010   HCT 43.0 01/22/2010   MCV 97.7 01/21/2009   PLT 256 01/21/2009   Lab Results  Component Value Date   CREATININE 1.0 10/17/2010   BUN 14 10/17/2010   NA 139 10/17/2010   K 4.2 10/17/2010   CL 105 10/17/2010   CO2 28 10/17/2010   Lab Results  Component Value Date   ALT 21 10/17/2010   AST 25 10/17/2010   ALKPHOS 85 10/17/2010   BILITOT 0.6 10/17/2010   Lab Results  Component Value Date   CHOL 150 12/27/2010   Lab Results  Component Value Date   HDL 52.70 12/27/2010   Lab Results  Component Value Date    LDLCALC 84 12/27/2010   Lab Results  Component Value Date   TRIG 66.0 12/27/2010   Lab Results  Component Value Date   CHOLHDL 3 12/27/2010     Assessment & Plan  Neuroma, Morton's Base of left foot. Painful with palpation  and weight bearing. Tolerable at present, possible ganglion cyst but more likely a neuroma, recommend ice and aspercreme tid and shoe inserts if no improvement will require referral to surgeon for removal  Atrial fibrillation In RRR today  Sinusitis acute Improved but is given a refill today secondary to concerns about getting sicker again over the holidays to use as needed. Return if no improvement

## 2011-05-10 ENCOUNTER — Ambulatory Visit (INDEPENDENT_AMBULATORY_CARE_PROVIDER_SITE_OTHER): Payer: Medicare Other | Admitting: Cardiology

## 2011-05-10 ENCOUNTER — Encounter: Payer: Self-pay | Admitting: Cardiology

## 2011-05-10 VITALS — BP 100/68 | HR 50 | Ht 66.0 in | Wt 176.0 lb

## 2011-05-10 DIAGNOSIS — E663 Overweight: Secondary | ICD-10-CM

## 2011-05-10 DIAGNOSIS — E782 Mixed hyperlipidemia: Secondary | ICD-10-CM

## 2011-05-10 DIAGNOSIS — E7800 Pure hypercholesterolemia, unspecified: Secondary | ICD-10-CM

## 2011-05-10 DIAGNOSIS — I4891 Unspecified atrial fibrillation: Secondary | ICD-10-CM

## 2011-05-10 DIAGNOSIS — I480 Paroxysmal atrial fibrillation: Secondary | ICD-10-CM

## 2011-05-10 LAB — HEPATIC FUNCTION PANEL
ALT: 22 U/L (ref 0–35)
Alkaline Phosphatase: 94 U/L (ref 39–117)
Bilirubin, Direct: 0.1 mg/dL (ref 0.0–0.3)
Total Protein: 7.1 g/dL (ref 6.0–8.3)

## 2011-05-10 LAB — LIPID PANEL
Total CHOL/HDL Ratio: 3
Triglycerides: 65 mg/dL (ref 0.0–149.0)

## 2011-05-10 LAB — BASIC METABOLIC PANEL
CO2: 30 mEq/L (ref 19–32)
Chloride: 107 mEq/L (ref 96–112)
Creatinine, Ser: 0.8 mg/dL (ref 0.4–1.2)

## 2011-05-10 NOTE — Progress Notes (Signed)
Autumn Moss Date of Birth:  11-06-1940 [facility] / [facility] [address]   [address] [place], [place]  [address] [phone]           Fax   [phone]  History of Present Illness: This pleasant 71 year old woman is seen for a scheduled followup office visit.  She is a past history of paroxysmal  Fibrillation and a history of hypercholesterolemia.  She has been doing well since her last visit.  She does not have any history of ischemic heart disease despite her hypercholesterolemia.  She had an echocardiogram in September 2010 showing normal LV systolic function with an ejection fraction of 55-60% and normal diastolic function and mild aortic sclerosis with mild aortic insufficiency and mild mitral regurgitation.  Current Outpatient Prescriptions  Medication Sig Dispense Refill  . aspirin 81 MG tablet Take 81 mg by mouth daily.        Marland Kitchen CALCIUM PO Take 1 tablet by mouth daily.        . digoxin (LANOXIN) 0.125 MG tablet TAKE 1 TABLET EVERY DAY  30 tablet  11  . Lutein 6 MG CAPS Take 2 capsules by mouth daily.       . metoprolol (LOPRESSOR) 50 MG tablet Take 1 tablet (50 mg total) by mouth as needed.  30 tablet  12  . metoprolol (TOPROL-XL) 50 MG 24 hr tablet Take 1 tablet (50 mg total) by mouth daily.  30 tablet  2  . multivitamin (THERAGRAN) per tablet Take 1 tablet by mouth daily.        Marland Kitchen omeprazole (PRILOSEC) 40 MG capsule Take 1 capsule (40 mg total) by mouth daily.  30 capsule  11  . simvastatin (ZOCOR) 20 MG tablet Take 1 tablet (20 mg total) by mouth every evening.  30 tablet  11    No Known Allergies  Patient Active Problem List  Diagnoses  . Mixed hyperlipidemia  . OVERWEIGHT  . Atrial fibrillation  . MEASLES, HX OF  . CHICKENPOX, HX OF  . Sinusitis acute  . Neuroma, Morton's    History  Smoking status  . Former Smoker -- 0.5 packs/day for 25 years  . Types: Cigarettes  . Quit date: 06/22/2005  Smokeless tobacco  . Never Used     History  Alcohol Use No    Family History  Problem Relation Age of Onset  . Kidney failure Mother   . Hypertension Mother   . Stroke Mother   . Arthritis Mother     RA  . Cancer Father     bladder cancer  . Atrial fibrillation Brother   . Sleep apnea Brother   . Hypertension Brother   . Breast cancer Sister     Review of Systems: Constitutional: no fever chills diaphoresis or fatigue or change in weight.  Head and neck: no hearing loss, no epistaxis, no photophobia or visual disturbance. Respiratory: No cough, shortness of breath or wheezing. Cardiovascular: No chest pain peripheral edema, palpitations. Gastrointestinal: No abdominal distention, no abdominal pain, no change in bowel habits hematochezia or melena. Genitourinary: No dysuria, no frequency, no urgency, no nocturia. Musculoskeletal:No arthralgias, no back pain, no gait disturbance or myalgias. Neurological: No dizziness, no headaches, no numbness, no seizures, no syncope, no weakness, no tremors. Hematologic: No lymphadenopathy, no easy bruising. Psychiatric: No confusion, no hallucinations, no sleep disturbance.    Physical Exam: Filed Vitals:   05/10/11 0950  BP: 100/68  Pulse: 50   the general appearance reveals  a well-developed well-nourished woman in no distress.The head and neck exam reveals pupils equal and reactive.  Extraocular movements are full.  There is no scleral icterus.  The mouth and pharynx are normal.  The neck is supple.  The carotids reveal no bruits.  The jugular venous pressure is normal.  The  thyroid is not enlarged.  There is no lymphadenopathy.  The chest is clear to percussion and auscultation.  There are no rales or rhonchi.  Expansion of the chest is symmetrical.  The precordium is quiet.  The first heart sound is normal.  The second heart sound is physiologically split.  There is a grade 1/6 soft systolic ejection murmur at the base.  No diastolic murmur.  There is no abnormal  lift or heave.  The abdomen is soft and nontender.  The bowel sounds are normal.  The liver and spleen are not enlarged.  There are no abdominal masses.  There are no abdominal bruits.  Extremities reveal good pedal pulses.  There is no phlebitis or edema.  There is no cyanosis or clubbing.  Strength is normal and symmetrical in all extremities.  There is no lateralizing weakness.  There are no sensory deficits.  The skin is warm and dry.  There is no rash.     Assessment / Plan: Patient is to continue same medication.  Recheck in 6 months for office visit and EKG and fasting lab work.  Blood work today is pending

## 2011-05-10 NOTE — Assessment & Plan Note (Signed)
The patient has not been experiencing any awareness of palpitations or tachycardia.  She denies any exertional chest pain or shortness of breath.  No dizziness or syncope.

## 2011-05-10 NOTE — Assessment & Plan Note (Signed)
Her weight today is unchanged from 6 months ago.  She will continue to try to lose weight through dietary means.

## 2011-05-10 NOTE — Assessment & Plan Note (Signed)
The patient is on simvastatin 20 mg daily for her hypercholesterolemia.  She's not having any side effects from the statin therapy.  Blood work today is pending.

## 2011-05-10 NOTE — Patient Instructions (Signed)
Your physician recommends that you continue on your current medications as directed. Please refer to the Current Medication list given to you today.  Will obtain labs today and call with the results  Your physician wants you to follow-up in: 6 months You will receive a reminder letter in the mail two months in advance. If you don't receive a letter, please call our office to schedule the follow-up appointment.

## 2011-05-14 ENCOUNTER — Telehealth: Payer: Self-pay | Admitting: *Deleted

## 2011-05-14 NOTE — Telephone Encounter (Signed)
Advised of labs 

## 2011-05-14 NOTE — Telephone Encounter (Signed)
Message copied by Burnell Blanks on Mon May 14, 2011 12:08 PM ------      Message from: Cassell Clement      Created: Thu May 10, 2011  6:26 PM       Please report.  The labs are stable.  Continue same meds.  Continue careful diet.

## 2011-06-08 ENCOUNTER — Other Ambulatory Visit: Payer: Self-pay | Admitting: *Deleted

## 2011-06-08 MED ORDER — METOPROLOL SUCCINATE ER 50 MG PO TB24: 50.0000 mg | ORAL_TABLET | Freq: Every day | ORAL | Status: DC

## 2011-06-08 NOTE — Telephone Encounter (Signed)
Refilled metoprolol 

## 2011-07-22 ENCOUNTER — Other Ambulatory Visit: Payer: Self-pay | Admitting: Cardiology

## 2011-07-23 NOTE — Telephone Encounter (Signed)
Refilled omeprzole

## 2011-09-06 ENCOUNTER — Encounter: Payer: Self-pay | Admitting: Family Medicine

## 2011-09-06 ENCOUNTER — Ambulatory Visit (INDEPENDENT_AMBULATORY_CARE_PROVIDER_SITE_OTHER): Payer: Medicare Other | Admitting: Family Medicine

## 2011-09-06 VITALS — BP 148/73 | HR 48 | Temp 98.4°F | Ht 66.0 in | Wt 180.0 lb

## 2011-09-06 DIAGNOSIS — L03116 Cellulitis of left lower limb: Secondary | ICD-10-CM

## 2011-09-06 DIAGNOSIS — L02619 Cutaneous abscess of unspecified foot: Secondary | ICD-10-CM

## 2011-09-06 DIAGNOSIS — L0291 Cutaneous abscess, unspecified: Secondary | ICD-10-CM

## 2011-09-06 DIAGNOSIS — L0390 Cellulitis, unspecified: Secondary | ICD-10-CM

## 2011-09-06 MED ORDER — ALIGN PO CAPS: 1.0000 | ORAL_CAPSULE | Freq: Every day | ORAL | Status: DC

## 2011-09-06 MED ORDER — CEFDINIR 300 MG PO CAPS: 300.0000 mg | ORAL_CAPSULE | Freq: Two times a day (BID) | ORAL | Status: AC

## 2011-09-06 NOTE — Assessment & Plan Note (Signed)
Large blister with significant erythema and swelling surrrounding. Blister cleansed with H2O2  And then drained with a sterile23 G needle. Improved discomfort after draining, start Cefdinir bid and probiotics, soak foot nightly

## 2011-09-06 NOTE — Progress Notes (Signed)
Patient ID: Autumn Moss, female   DOB: 04-Jan-1941, 71 y.o.   MRN: [id number] Autumn Moss [id number] 1940/10/23 09/06/2011      Progress Note-Follow Up  Subjective  Chief Complaint  Chief Complaint  Patient presents with  . Blister    on bottom of left foot X 2 days    HPI  Patient is a 71 year old Caucasian female with a two-day history of increasing left foot pain. She notes at the ball of her foot at the base of her great toe she has a raised tender to touch lesion she thinks is a blister from her new shoe inserts. She denies fevers, chills, malaise, myalgias or systemic symptoms. No chest pain, palpitations, shortness of breath. She has pain persistent on her lower foot and no where else. No concerning symptoms other than pain  Past Medical History  Diagnosis Date  . Hypertension   . Paroxysmal atrial fibrillation   . Palpitations     sporadic  . Hyperlipidemia   . Aortic valve disease     mild - 07/05/05 per echocardiogram   . Left ventricular diastolic dysfunction     mild - 07/05/05 per echocardiogram  . COPD (chronic obstructive pulmonary disease)   . Ectopic pregnancy     history of x2  . Dyspepsia     occasional  . History of echocardiogram 01/26/2009    Est. EF of 55-60%  --  MILD MITRAL REGURGITATION  -  SINCE LAST ECHO OF 07/10/05 NO SIGNIFICANT CHANGES.  ---  Clovis Pu Brackbill, MD  . Sinusitis acute 04/10/2011  . 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 Ectopic pregnancy surgery     bilateral, both tubes excised and 1 ovary excised  . Oophorectomy   . Appendectomy     Family History  Problem Relation Age of Onset  . Kidney failure Mother   . Hypertension Mother   . Stroke Mother   . Arthritis Mother     RA  . Cancer Father     bladder cancer  . Atrial fibrillation Brother   . Sleep apnea Brother   . Hypertension Brother   . Breast cancer Sister     History   Social History  . Marital Status: Married      Spouse Name: Merlyn Albert    Number of Children: 1  . Years of Education: N/A   Occupational History  .     Social History Main Topics  . Smoking status: Former Smoker -- 0.5 packs/day for 25 years    Types: Cigarettes    Quit date: 06/22/2005  . Smokeless tobacco: Never Used  . Alcohol Use: No  . Drug Use: No  . Sexually Active: Not on file   Other Topics Concern  . Not on file   Social History Narrative   Uses seat beltWorked at [employer], [employer] exercise: yes, goes to Y daily and walksDiet, no restrictions, minimizes dairy    Current Outpatient Prescriptions on File Prior to Visit  Medication Sig Dispense Refill  . aspirin 81 MG tablet Take 81 mg by mouth daily.        . digoxin (LANOXIN) 0.125 MG tablet TAKE 1 TABLET EVERY DAY  30 tablet  11  . Lutein 6 MG CAPS Take 2 capsules by mouth daily.       . metoprolol (LOPRESSOR) 50 MG tablet Take 1 tablet (50 mg total) by mouth as needed.  30 tablet  12  .  metoprolol succinate (TOPROL-XL) 50 MG 24 hr tablet Take 1 tablet (50 mg total) by mouth daily.  30 tablet  11  . multivitamin (THERAGRAN) per tablet Take 1 tablet by mouth daily.        Marland Kitchen omeprazole (PRILOSEC) 40 MG capsule TAKE ONE CAPSULE BY MOUTH DAILY  30 capsule  5  . simvastatin (ZOCOR) 20 MG tablet Take 1 tablet (20 mg total) by mouth every evening.  30 tablet  11  . CALCIUM PO Take 1 tablet by mouth daily.          No Known Allergies  Review of Systems Review of Systems  Constitutional: Negative for fever and malaise/fatigue.  HENT: Negative for congestion.   Eyes: Negative for discharge.  Respiratory: Negative for shortness of breath.   Cardiovascular: Negative for chest pain, palpitations and leg swelling.  Gastrointestinal: Negative for nausea, abdominal pain and diarrhea.  Genitourinary: Negative for dysuria.  Musculoskeletal: Positive for joint pain. Negative for falls.       Left foot pain, blister developed after wearing new shoe inserts.  Skin:  Negative for rash.  Neurological: Negative for loss of consciousness and headaches.  Endo/Heme/Allergies: Negative for polydipsia.  Psychiatric/Behavioral: Negative for depression and suicidal ideas. The patient is not nervous/anxious and does not have insomnia.     Objective  BP 148/73  Pulse 48  Temp(Src) 98.4 F (36.9 C) (Temporal)  Ht 5\' 6"  (1.676 m)  Wt 180 lb (81.647 kg)  BMI 29.05 kg/m2  SpO2 96%  Physical Exam  Physical Exam  Constitutional: She is oriented to person, place, and time and well-developed, well-nourished, and in no distress. No distress.  HENT:  Head: Normocephalic and atraumatic.  Eyes: Conjunctivae are normal.  Neck: Neck supple. No thyromegaly present.  Cardiovascular: Normal rate, regular rhythm and normal heart sounds.   No murmur heard. Pulmonary/Chest: Effort normal and breath sounds normal. She has no wheezes.  Abdominal: She exhibits no distension and no mass.  Musculoskeletal: She exhibits no edema.  Lymphadenopathy:    She has no cervical adenopathy.  Neurological: She is alert and oriented to person, place, and time.  Skin: Skin is warm and dry. Rash noted. She is not diaphoretic. There is erythema.       1x2 cm blister that drains serous fluid when ruptured. Surrounding flutuance of skin at base of toes noted.  Psychiatric: Memory, affect and judgment normal.    No results found for this basename: TSH   Lab Results  Component Value Date   WBC 5.7 01/21/2009   HGB 14.6 01/22/2010   HCT 43.0 01/22/2010   MCV 97.7 01/21/2009   PLT 256 01/21/2009   Lab Results  Component Value Date   CREATININE 0.8 05/10/2011   BUN 13 05/10/2011   NA 144 05/10/2011   K 4.6 05/10/2011   CL 107 05/10/2011   CO2 30 05/10/2011   Lab Results  Component Value Date   ALT 22 05/10/2011   AST 26 05/10/2011   ALKPHOS 94 05/10/2011   BILITOT 0.6 05/10/2011   Lab Results  Component Value Date   CHOL 155 05/10/2011   Lab Results  Component Value Date   HDL 59.10 05/10/2011     Lab Results  Component Value Date   LDLCALC 83 05/10/2011   Lab Results  Component Value Date   TRIG 65.0 05/10/2011   Lab Results  Component Value Date   CHOLHDL 3 05/10/2011     Assessment & Plan  Cellulitis of left  foot Large blister with significant erythema and swelling surrrounding. Blister cleansed with H2O2  And then drained with a sterile23 G needle. Improved discomfort after draining, start Cefdinir bid and probiotics, soak foot nightly

## 2011-09-06 NOTE — Patient Instructions (Signed)
Cellulitis Cellulitis is an infection of the skin and the tissue beneath it. The area is typically red and tender. It is caused by germs (bacteria) (usually staph or strep) that enter the body through cuts or sores. Cellulitis most commonly occurs in the arms or lower legs.  HOME CARE INSTRUCTIONS   If you are given a prescription for medications which kill germs (antibiotics), take as directed until finished.   If the infection is on the arm or leg, keep the limb elevated as able.   Use a warm cloth several times per day to relieve pain and encourage healing.   See your caregiver for recheck of the infected site as directed if problems arise.   Only take over-the-counter or prescription medicines for pain, discomfort, or fever as directed by your caregiver.  SEEK MEDICAL CARE IF:   The area of redness (inflammation) is spreading, there are red streaks coming from the infected site, or if a part of the infection begins to turn dark in color.   The joint or bone underneath the infected skin becomes painful after the skin has healed.   The infection returns in the same or another area after it seems to have gone away.   A boil or bump swells up. This may be an abscess.   New, unexplained problems such as pain or fever develop.  SEEK IMMEDIATE MEDICAL CARE IF:   You have a fever.   You or your child feels drowsy or lethargic.   There is vomiting, diarrhea, or lasting discomfort or feeling ill (malaise) with muscle aches and pains.  MAKE SURE YOU:   Understand these instructions.   Will watch your condition.   Will get help right away if you are not doing well or get worse.  Document Released: 01/31/2005 Document Revised: 04/12/2011 Document Reviewed: 12/10/2007 [facility] Patient Information 2012 [place], [place]. Soak the foot in hot water and hydrogen peroxide for 10 to 15 minutes twice daily for next 2 -3 days and then as needed

## 2011-09-09 LAB — WOUND CULTURE
Gram Stain: NONE SEEN
Organism ID, Bacteria: NO GROWTH

## 2011-09-10 NOTE — Progress Notes (Signed)
Testosterone printed on accident. I shredded RX

## 2011-10-09 ENCOUNTER — Encounter: Payer: Medicare Other | Admitting: Family Medicine

## 2011-10-11 ENCOUNTER — Other Ambulatory Visit: Payer: Medicare Other

## 2011-10-11 ENCOUNTER — Ambulatory Visit: Payer: Medicare Other | Admitting: Cardiology

## 2011-10-12 ENCOUNTER — Ambulatory Visit (INDEPENDENT_AMBULATORY_CARE_PROVIDER_SITE_OTHER): Payer: Medicare Other | Admitting: Cardiology

## 2011-10-12 ENCOUNTER — Encounter: Payer: Self-pay | Admitting: Cardiology

## 2011-10-12 ENCOUNTER — Other Ambulatory Visit (INDEPENDENT_AMBULATORY_CARE_PROVIDER_SITE_OTHER): Payer: Medicare Other

## 2011-10-12 VITALS — BP 118/70 | HR 47 | Ht 65.0 in | Wt 180.0 lb

## 2011-10-12 DIAGNOSIS — Z79899 Other long term (current) drug therapy: Secondary | ICD-10-CM

## 2011-10-12 DIAGNOSIS — E663 Overweight: Secondary | ICD-10-CM

## 2011-10-12 DIAGNOSIS — I4891 Unspecified atrial fibrillation: Secondary | ICD-10-CM

## 2011-10-12 DIAGNOSIS — I480 Paroxysmal atrial fibrillation: Secondary | ICD-10-CM

## 2011-10-12 DIAGNOSIS — E7800 Pure hypercholesterolemia, unspecified: Secondary | ICD-10-CM

## 2011-10-12 DIAGNOSIS — R0989 Other specified symptoms and signs involving the circulatory and respiratory systems: Secondary | ICD-10-CM

## 2011-10-12 DIAGNOSIS — E782 Mixed hyperlipidemia: Secondary | ICD-10-CM

## 2011-10-12 LAB — BASIC METABOLIC PANEL
BUN: 15 mg/dL (ref 6–23)
Calcium: 9.1 mg/dL (ref 8.4–10.5)
GFR: 88.56 mL/min (ref >60.00)
Glucose, Bld: 92 mg/dL (ref 70–99)
Potassium: 4.1 mEq/L (ref 3.5–5.1)
Sodium: 140 mEq/L (ref 135–145)

## 2011-10-12 LAB — LIPID PANEL
Cholesterol: 141 mg/dL (ref 0–200)
HDL: 54.4 mg/dL (ref >39.00)
VLDL: 11.6 mg/dL (ref 0.0–40.0)

## 2011-10-12 LAB — HEPATIC FUNCTION PANEL
ALT: 18 U/L (ref 0–35)
AST: 20 U/L (ref 0–37)
Bilirubin, Direct: 0 mg/dL (ref 0.0–0.3)
Total Bilirubin: 0.5 mg/dL (ref 0.3–1.2)
Total Protein: 6.9 g/dL (ref 6.0–8.3)

## 2011-10-12 NOTE — Assessment & Plan Note (Signed)
The patient has a history of hypercholesterolemia.  We are checking blood work today.  She remains on low-dose simvastatin 20 mg daily and is not having any myalgias or side effects.

## 2011-10-12 NOTE — Assessment & Plan Note (Signed)
The patient has gained 4 more pounds since her last visit.  She is going to work harder on low carbohydrate diet and be sure to get plenty of exercise

## 2011-10-12 NOTE — Patient Instructions (Addendum)
Will obtain labs today and call you with the results   Work on weight loss  Your physician recommends that you continue on your current medications as directed. Please refer to the Current Medication list given to you today.  Your physician wants you to follow-up in: 6 months with fasting labs You will receive a reminder letter in the mail two months in advance. If you don't receive a letter, please call our office to schedule the follow-up appointment.

## 2011-10-12 NOTE — Assessment & Plan Note (Signed)
The patient has had no recurrence of her atrial fibrillation.  She has not been having any palpitations or tachycardia.  She has been walking at the [facility] for exercise.  She denies exertional chest pain or dizzy spells or syncope

## 2011-10-12 NOTE — Progress Notes (Signed)
Autumn Moss Date of Birth:  1940/11/18 [facility] [address] [place], [place]  [address] [phone]         Fax   [phone]  History of Present Illness: This pleasant 71 year old woman is seen for a scheduled 6 month followup office visit.  She has a history of palpitations and atypical chest pain.  She also has a past history of paroxysmal atrial fibrillation.  She has a history of hypercholesterolemia.  She does not have any history of ischemic heart disease.  She has not had an ischemic testing.  She did have an echocardiogram in September 2010 showing an ejection fraction of 55-60% with normal diastolic function and mild aortic sclerosis with mild aortic insufficiency and mild mitral regurgitation.  Current Outpatient Prescriptions  Medication Sig Dispense Refill  . aspirin 81 MG tablet Take 81 mg by mouth daily.        Marland Kitchen CALCIUM PO Take 1 tablet by mouth daily.        . digoxin (LANOXIN) 0.125 MG tablet TAKE 1 TABLET EVERY DAY  30 tablet  11  . Lutein 6 MG CAPS Take 2 capsules by mouth daily.       . metoprolol (LOPRESSOR) 50 MG tablet Take 1 tablet (50 mg total) by mouth as needed.  30 tablet  12  . metoprolol succinate (TOPROL-XL) 50 MG 24 hr tablet Take 1 tablet (50 mg total) by mouth daily.  30 tablet  11  . multivitamin (THERAGRAN) per tablet Take 1 tablet by mouth daily.        Marland Kitchen omeprazole (PRILOSEC) 40 MG capsule TAKE ONE CAPSULE BY MOUTH DAILY  30 capsule  5  . simvastatin (ZOCOR) 20 MG tablet Take 1 tablet (20 mg total) by mouth every evening.  30 tablet  11    No Known Allergies  Patient Active Problem List  Diagnoses  . Mixed hyperlipidemia  . OVERWEIGHT  . Atrial fibrillation  . MEASLES, HX OF  . CHICKENPOX, HX OF  . Sinusitis acute  . Neuroma, Morton's  . Cellulitis of left foot    History  Smoking status  . Former Smoker -- 0.5 packs/day for 25 years  . Types: Cigarettes  . Quit date: 06/22/2005  Smokeless tobacco    . Never Used    History  Alcohol Use No    Family History  Problem Relation Age of Onset  . Kidney failure Mother   . Hypertension Mother   . Stroke Mother   . Arthritis Mother     RA  . Cancer Father     bladder cancer  . Atrial fibrillation Brother   . Sleep apnea Brother   . Hypertension Brother   . Breast cancer Sister     Review of Systems: Constitutional: no fever chills diaphoresis or fatigue or change in weight.  Head and neck: no hearing loss, no epistaxis, no photophobia or visual disturbance. Respiratory: No cough, shortness of breath or wheezing. Cardiovascular: No chest pain peripheral edema, palpitations. Gastrointestinal: No abdominal distention, no abdominal pain, no change in bowel habits hematochezia or melena. Genitourinary: No dysuria, no frequency, no urgency, no nocturia. Musculoskeletal:No arthralgias, no back pain, no gait disturbance or myalgias. Neurological: No dizziness, no headaches, no numbness, no seizures, no syncope, no weakness, no tremors. Hematologic: No lymphadenopathy, no easy bruising. Psychiatric: No confusion, no hallucinations, no sleep disturbance.    Physical Exam: Filed Vitals:   10/12/11 1032  BP: 118/70  Pulse: 47  the general appearance reveals a well-developed well-nourished woman in no distress.The head and neck exam reveals pupils equal and reactive.  Extraocular movements are full.  There is no scleral icterus.  The mouth and pharynx are normal.  The neck is supple.  The carotids reveal no bruits.  The jugular venous pressure is normal.  The  thyroid is not enlarged.  There is no lymphadenopathy.  The chest is clear to percussion and auscultation.  There are no rales or rhonchi.  Expansion of the chest is symmetrical.  The precordium is quiet.  The first heart sound is normal.  The second heart sound is physiologically split.  There is no  gallop rub or click.  There is a soft grade 1/6 systolic ejection murmur at the  base but no diastolic murmur. There is no abnormal lift or heave.  The abdomen is soft and nontender.  The bowel sounds are normal.  The liver and spleen are not enlarged.  There are no abdominal masses.  There are no abdominal bruits.  Extremities reveal good pedal pulses.  There is no phlebitis or edema.  There is no cyanosis or clubbing.  Strength is normal and symmetrical in all extremities.  There is no lateralizing weakness.  There are no sensory deficits.  The skin is warm and dry.  There is no rash.   EKG today shows marked sinus bradycardia at 47 per minute.  No ischemic changes.  Assessment / Plan: Continue on same medication.  She's not having any symptoms from her bradycardia.  She states that her heart rate accelerates normally when she goes to the [facility] to exercise.  Lab work today is pending.  Recheck in 6 months for followup office visit and fasting lab work

## 2011-10-14 NOTE — Progress Notes (Signed)
Quick Note:  Please report to patient. The recent labs are stable. Continue same medication and careful diet. ______ 

## 2011-10-15 ENCOUNTER — Other Ambulatory Visit: Payer: Self-pay | Admitting: Cardiology

## 2011-10-16 ENCOUNTER — Telehealth: Payer: Self-pay | Admitting: *Deleted

## 2011-10-16 NOTE — Telephone Encounter (Signed)
Message copied by Burnell Blanks on Tue Oct 16, 2011  3:34 PM ------      Message from: Cassell Clement      Created: Sun Oct 14, 2011  8:27 AM       Please report to patient.  The recent labs are stable. Continue same medication and careful diet.

## 2011-10-16 NOTE — Telephone Encounter (Signed)
Advised patient of lab results  

## 2011-11-19 ENCOUNTER — Encounter: Payer: Self-pay | Admitting: Nurse Practitioner

## 2011-11-19 ENCOUNTER — Ambulatory Visit (INDEPENDENT_AMBULATORY_CARE_PROVIDER_SITE_OTHER): Payer: Medicare Other | Admitting: Nurse Practitioner

## 2011-11-19 ENCOUNTER — Encounter: Payer: Self-pay | Admitting: Family Medicine

## 2011-11-19 ENCOUNTER — Ambulatory Visit (INDEPENDENT_AMBULATORY_CARE_PROVIDER_SITE_OTHER): Payer: Medicare Other | Admitting: Family Medicine

## 2011-11-19 VITALS — BP 111/61 | HR 81 | Temp 97.7°F | Ht 65.0 in | Wt 175.8 lb

## 2011-11-19 VITALS — BP 120/74 | HR 76 | Ht 64.0 in | Wt 176.8 lb

## 2011-11-19 DIAGNOSIS — I4891 Unspecified atrial fibrillation: Secondary | ICD-10-CM

## 2011-11-19 MED ORDER — RIVAROXABAN 20 MG PO TABS: 20.0000 mg | ORAL_TABLET | Freq: Every day | ORAL | Status: DC

## 2011-11-19 MED ORDER — METOPROLOL SUCCINATE ER 50 MG PO TB24: 50.0000 mg | ORAL_TABLET | Freq: Two times a day (BID) | ORAL | Status: DC

## 2011-11-19 NOTE — Patient Instructions (Signed)
Atrial Fibrillation Your caregiver has diagnosed you with atrial fibrillation (AFib). The heart normally beats very regularly; AFib is a type of irregular heartbeat. The heart rate may be faster or slower than normal. This can prevent your heart from pumping as well as it should. AFib can be constant (chronic) or intermittent (paroxysmal). CAUSES  Atrial fibrillation may be caused by:  Heart disease, including heart attack, coronary artery disease, heart failure, diseases of the heart valves, and others.   Blood clot in the lungs (pulmonary embolism).   Pneumonia or other infections.   Chronic lung disease.   Thyroid disease.   Toxins. These include alcohol, some medications (such as decongestant medications or diet pills), and caffeine.  In some people, no cause for AFib can be found. This is referred to as Lone Atrial Fibrillation. SYMPTOMS   Palpitations or a fluttering in your chest.   A vague sense of chest discomfort.   Shortness of breath.   Sudden onset of lightheadedness or weakness.  Sometimes, the first sign of AFib can be a complication of the condition. This could be a stroke or heart failure. DIAGNOSIS  Your description of your condition may make your caregiver suspicious of atrial fibrillation. Your caregiver will examine your pulse to determine if fibrillation is present. An EKG (electrocardiogram) will confirm the diagnosis. Further testing may help determine what caused you to have atrial fibrillation. This may include chest x-ray, echocardiogram, blood tests, or CT scans. PREVENTION  If you have previously had atrial fibrillation, your caregiver may advise you to avoid substances known to cause the condition (such as stimulant medications, and possibly caffeine or alcohol). You may be advised to use medications to prevent recurrence. Proper treatment of any underlying condition is important to help prevent recurrence. PROGNOSIS  Atrial fibrillation does tend to  become a chronic condition over time. It can cause significant complications (see below). Atrial fibrillation is not usually immediately life-threatening, but it can shorten your life expectancy. This seems to be worse in women. If you have lone atrial fibrillation and are under 60 years old, the risk of complications is very low, and life expectancy is not shortened. RISKS AND COMPLICATIONS  Complications of atrial fibrillation can include stroke, chest pain, and heart failure. Your caregiver will recommend treatments for the atrial fibrillation, as well as for any underlying conditions, to help minimize risk of complications. TREATMENT  Treatment for AFib is divided into several categories:  Treatment of any underlying condition.   Converting you out of AFib into a regular (sinus) rhythm.   Controlling rapid heart rate.   Prevention of blood clots and stroke.  Medications and procedures are available to convert your atrial fibrillation to sinus rhythm. However, recent studies have shown that this may not offer you any advantage, and cardiac experts are continuing research and debate on this topic. More important is controlling your rapid heartbeat. The rapid heartbeat causes more symptoms, and places strain on your heart. Your caregiver will advise you on the use of medications that can control your heart rate. Atrial fibrillation is a strong stroke risk. You can lessen this risk by taking blood thinning medications such as Coumadin (warfarin), or sometimes aspirin. These medications need close monitoring by your caregiver. Over-medication can cause bleeding. Too little medication may not protect against stroke. HOME CARE INSTRUCTIONS   If your caregiver prescribed medicine to make your heartbeat more normally, take as directed.   If blood thinners were prescribed by your caregiver, take EXACTLY as directed.     Perform blood tests EXACTLY as directed.   Quit smoking. Smoking increases your  cardiac and lung (pulmonary) risks.   DO NOT drink alcohol.   DO NOT drink caffeinated drinks (e.g. coffee, soda, chocolate, and leaf teas). You may drink decaffeinated coffee, soda or tea.   If you are overweight, you should choose a reduced calorie diet to lose weight. Please see a registered dietitian if you need more information about healthy weight loss. DO NOT USE DIET PILLS as they may aggravate heart problems.   If you have other heart problems that are causing AFib, you may need to eat a low salt, fat, and cholesterol diet. Your caregiver will tell you if this is necessary.   Exercise every day to improve your physical fitness. Stay active unless advised otherwise.   If your caregiver has given you a follow-up appointment, it is very important to keep that appointment. Not keeping the appointment could result in heart failure or stroke. If there is any problem keeping the appointment, you must call back to this facility for assistance.  SEEK MEDICAL CARE IF:  You notice a change in the rate, rhythm or strength of your heartbeat.   You develop an infection or any other change in your overall health status.  SEEK IMMEDIATE MEDICAL CARE IF:   You develop chest pain, abdominal pain, sweating, weakness or feel sick to your stomach (nausea).   You develop shortness of breath.   You develop swollen feet and ankles.   You develop dizziness, numbness, or weakness of your face or limbs, or any change in vision or speech.  MAKE SURE YOU:   Understand these instructions.   Will watch your condition.   Will get help right away if you are not doing well or get worse.  Document Released: 04/23/2005 Document Revised: 04/12/2011 Document Reviewed: 11/26/2007 [facility] Patient Information 2012 [place], [place].    Appt with [facility] today at 11:45am, have your husband drive you down there. Seek immediate care in the next hour if chest pain and sob occurs between now and then, go  home and rest til then.

## 2011-11-19 NOTE — Progress Notes (Signed)
Patient ID: Autumn Moss, female   DOB: 1940-10-11, 71 y.o.   MRN: [id number] Autumn Moss [id number] [birth year] 11/19/2011      Progress Note-Follow Up  Subjective  Chief Complaint  Chief Complaint  Patient presents with  . Follow-up    2 month  . Atrial Fibrillation    since Sat night at 10- no chest pains, no SOB- tired    HPI  Patient is a 71 year old Caucasian female who is in today complaining of atrial fibrillation since Saturday night. She follows with Dr. Cassell Clement of [facility] and has had such infrequent symptoms that passed quickly for several years now that she has not been on any significant anticoagulants or had recurrent episodes. She has occasional episodes of fleeting symptoms which resolve spontaneously or with a half a dose of short acting metoprolol. This episode however started Saturday and has not fully resolved. She took a half of metoprolol tartrate Saturday night it helps calm down the palpitations and slow her heart rate. She then took another half a tablet Sunday which helped again for awhile, then at 4:30 in the morning last night she woke up with her heart racing he took an entire metoprolol tartrate discomfort partly down again and she is now here for evaluation. Denying any chest pain, shortness of breath, diaphoresis, nausea, syncope or other concerning symptoms. She can just tell that her heart continues to skip beats. She also denies any recent illness, GI or GU complaints. She's not had any increased stressors or change in diet or caffeine intake. Is not taking her digoxin on metoprolol succinate this morning as she normally would. Normally she takes in about 10 in the morning. She is instructed to take them while she is here  Past Medical History  Diagnosis Date  . Hypertension   . Paroxysmal atrial fibrillation   . Palpitations     sporadic  . Hyperlipidemia   . Aortic valve disease     mild - 07/05/05 per echocardiogram   .  Left ventricular diastolic dysfunction     mild - 07/05/05 per echocardiogram  . COPD (chronic obstructive pulmonary disease)   . Ectopic pregnancy     history of x2  . Dyspepsia     occasional  . History of echocardiogram 01/26/2009    Est. EF of 55-60%  --  MILD MITRAL REGURGITATION  -  SINCE LAST ECHO OF 07/10/05 NO SIGNIFICANT CHANGES.  ---  Clovis Pu Brackbill, MD  . Sinusitis acute 04/10/2011  . 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 Ectopic pregnancy surgery     bilateral, both tubes excised and 1 ovary excised  . Oophorectomy   . Appendectomy     Family History  Problem Relation Age of Onset  . Kidney failure Mother   . Hypertension Mother   . Stroke Mother   . Arthritis Mother     RA  . Cancer Father     bladder cancer  . Atrial fibrillation Brother   . Sleep apnea Brother   . Hypertension Brother   . Breast cancer Sister     History   Social History  . Marital Status: Married    Spouse Name: Merlyn Albert    Number of Children: 1  . Years of Education: N/A   Occupational History  .     Social History Main Topics  . Smoking status: Former Smoker -- 0.5 packs/day for 25 years    Types:  Cigarettes    Quit date: 06/22/2005  . Smokeless tobacco: Never Used  . Alcohol Use: No  . Drug Use: No  . Sexually Active: Not on file   Other Topics Concern  . Not on file   Social History Narrative   Uses seat beltWorked at [employer], [employer] exercise: yes, [facility] and walksDiet, no restrictions, minimizes dairy    Current Outpatient Prescriptions on File Prior to Visit  Medication Sig Dispense Refill  . aspirin 81 MG tablet Take 81 mg by mouth daily.        Marland Kitchen CALCIUM PO Take 1 tablet by mouth daily.        . digoxin (LANOXIN) 0.125 MG tablet TAKE 1 TABLET EVERY DAY  30 tablet  11  . Lutein 6 MG CAPS Take 2 capsules by mouth daily.       . metoprolol (LOPRESSOR) 50 MG tablet Take 1 tablet (50 mg total) by mouth as needed.  30  tablet  12  . metoprolol succinate (TOPROL-XL) 50 MG 24 hr tablet Take 1 tablet (50 mg total) by mouth daily.  30 tablet  11  . multivitamin (THERAGRAN) per tablet Take 1 tablet by mouth daily.        Marland Kitchen omeprazole (PRILOSEC) 40 MG capsule TAKE ONE CAPSULE BY MOUTH DAILY  30 capsule  5  . simvastatin (ZOCOR) 20 MG tablet TAKE 1 TABLET BY MOUTH EVERY EVENING  30 tablet  5    No Known Allergies  Review of Systems  Review of Systems  Constitutional: Negative for fever and malaise/fatigue.  HENT: Negative for congestion.   Eyes: Negative for discharge.  Respiratory: Negative for shortness of breath.   Cardiovascular: Positive for palpitations. Negative for chest pain and leg swelling.  Gastrointestinal: Negative for nausea, abdominal pain and diarrhea.  Genitourinary: Negative for dysuria.  Musculoskeletal: Negative for falls.  Skin: Negative for rash.  Neurological: Negative for dizziness, loss of consciousness and headaches.  Endo/Heme/Allergies: Negative for polydipsia.  Psychiatric/Behavioral: Negative for depression and suicidal ideas. The patient is not nervous/anxious and does not have insomnia.     Objective  BP 111/61  Pulse 81  Temp 97.7 F (36.5 C) (Temporal)  Ht 5\' 5"  (1.651 m)  Wt 175 lb 12.8 oz (79.742 kg)  BMI 29.25 kg/m2  SpO2 96%  Physical Exam  Physical Exam  Constitutional: She is oriented to person, place, and time and well-developed, well-nourished, and in no distress. No distress.  HENT:  Head: Normocephalic and atraumatic.  Eyes: Conjunctivae are normal.  Neck: Neck supple. No thyromegaly present.  Cardiovascular: Normal rate and normal heart sounds.   No murmur heard.      Irregularly irregular heart rhythm   Pulmonary/Chest: Effort normal and breath sounds normal. She has no wheezes.  Abdominal: She exhibits no distension and no mass.  Musculoskeletal: She exhibits no edema.  Lymphadenopathy:    She has no cervical adenopathy.  Neurological:  She is alert and oriented to person, place, and time.  Skin: Skin is warm and dry. No rash noted. She is not diaphoretic.  Psychiatric: Memory, affect and judgment normal.    No results found for this basename: TSH   Lab Results  Component Value Date   WBC 5.7 01/21/2009   HGB 14.6 01/22/2010   HCT 43.0 01/22/2010   MCV 97.7 01/21/2009   PLT 256 01/21/2009   Lab Results  Component Value Date   CREATININE 0.7 10/12/2011   BUN 15 10/12/2011  NA 140 10/12/2011   K 4.1 10/12/2011   CL 106 10/12/2011   CO2 30 10/12/2011   Lab Results  Component Value Date   ALT 18 10/12/2011   AST 20 10/12/2011   ALKPHOS 87 10/12/2011   BILITOT 0.5 10/12/2011   Lab Results  Component Value Date   CHOL 141 10/12/2011   Lab Results  Component Value Date   HDL 54.40 10/12/2011   Lab Results  Component Value Date   LDLCALC 75 10/12/2011   Lab Results  Component Value Date   TRIG 58.0 10/12/2011   Lab Results  Component Value Date   CHOLHDL 3 10/12/2011     Assessment & Plan  Atrial fibrillation Patient with a history of recurrent paroxysmal atrial fibrillation has been managed by Dr. Patty Sermons. Generally her episodes are short-lived and he takes a half of her short acting Toprol and the symptoms resolved. Unfortunately about 2 days ago she had episodes that started it has calmed down with the extra metoprolol but not fully gone. Since Saturday night she's had a sense of her heart beating rapidly and irregularly. She denies chest pain, shortness of breath or any other concerning symptoms. She denies any recent illness or stressors. She denies missing any doses of her digoxin for her metoprolol succinate. On Saturday night when the symptoms started she took a half of her metoprolol tartrate symptoms, Atrovent and another half again on Sunday. At 4:30 in the morning this past night she woke up with her heart racing he took an entire metoprolol tartrate to calm down her pulse and is here for evaluation. We have arranged for  her to see cardiology at 1145 today. She is given 4 81 mg aspirins in the office and she is asked to take her digoxin and her metoprolol succinate while she is here. Her husband is her driver and they will get her down to cardiology this morning. She will seek immediate care she has a change in her symptoms such as shortness of breath or chest pain.

## 2011-11-19 NOTE — Assessment & Plan Note (Signed)
Patient with a history of recurrent paroxysmal atrial fibrillation has been managed by Dr. Patty Sermons. Generally her episodes are short-lived and he takes a half of her short acting Toprol and the symptoms resolved. Unfortunately about 2 days ago she had episodes that started it has calmed down with the extra metoprolol but not fully gone. Since Saturday night she's had a sense of her heart beating rapidly and irregularly. She denies chest pain, shortness of breath or any other concerning symptoms. She denies any recent illness or stressors. She denies missing any doses of her digoxin for her metoprolol succinate. On Saturday night when the symptoms started she took a half of her metoprolol tartrate symptoms, Atrovent and another half again on Sunday. At 4:30 in the morning this past night she woke up with her heart racing he took an entire metoprolol tartrate to calm down her pulse and is here for evaluation. We have arranged for her to see cardiology at 1145 today. She is given 4 81 mg aspirins in the office and she is asked to take her digoxin and her metoprolol succinate while she is here. Her husband is her driver and they will get her down to cardiology this morning. She will seek immediate care she has a change in her symptoms such as shortness of breath or chest pain.

## 2011-11-19 NOTE — Progress Notes (Signed)
Autumn Moss Date of Birth: 07-29-1940 Medical Record #[id number]  History of Present Illness: Autumn Moss is seen today for a work in visit. She is seen for Dr. Patty Sermons. She is a very pleasant 71 year old female with a history of PAF, mild valvular heart disease and HTN. No prior stroke and not diabetic. She was last seen here back in June and was felt to be doing well.  She comes in today. She is here alone. She was referred by her PCP. She is back in atrial fib. It started Saturday night. No trigger that she can identify. Does not use caffeine or alcohol. Notes that her heart has been racing and beating hard. She feels tired. Some dizziness but no syncope. She is leaving for the beach for the next several weeks on Wednesday. Typically her heart rate is in the high 40's and now has been up in the 90's. No chest pain. She is anxious. She has taken two extra doses of her short acting metoprolol with no results. This is her longest episode in quite some time.   Current Outpatient Prescriptions on File Prior to Visit  Medication Sig Dispense Refill  . aspirin 81 MG tablet Take 81 mg by mouth daily.        Marland Kitchen CALCIUM PO Take 1 tablet by mouth daily.        . digoxin (LANOXIN) 0.125 MG tablet TAKE 1 TABLET EVERY DAY  30 tablet  11  . Lutein 6 MG CAPS Take 2 capsules by mouth daily.       . metoprolol (LOPRESSOR) 50 MG tablet Take 1 tablet (50 mg total) by mouth as needed.  30 tablet  12  . metoprolol succinate (TOPROL-XL) 50 MG 24 hr tablet Take 1 tablet (50 mg total) by mouth daily.  30 tablet  11  . multivitamin (THERAGRAN) per tablet Take 1 tablet by mouth daily.        Marland Kitchen omeprazole (PRILOSEC) 40 MG capsule TAKE ONE CAPSULE BY MOUTH DAILY  30 capsule  5  . simvastatin (ZOCOR) 20 MG tablet TAKE 1 TABLET BY MOUTH EVERY EVENING  30 tablet  5    No Known Allergies  Past Medical History  Diagnosis Date  . Hypertension   . Paroxysmal atrial fibrillation   . Palpitations     sporadic  .  Hyperlipidemia   . Aortic valve disease     mild - 07/05/05 per echocardiogram   . Left ventricular diastolic dysfunction     mild - 07/05/05 per echocardiogram  . COPD (chronic obstructive pulmonary disease)   . Ectopic pregnancy     history of x2  . Dyspepsia     occasional  . History of echocardiogram 01/26/2009    Est. EF of 55-60%  --  MILD MITRAL REGURGITATION  -  SINCE LAST ECHO OF 07/10/05 NO SIGNIFICANT CHANGES.  ---  Clovis Pu Brackbill, MD  . Sinusitis acute 04/10/2011  . 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 Ectopic pregnancy surgery     bilateral, both tubes excised and 1 ovary excised  . Oophorectomy   . Appendectomy     History  Smoking status  . Former Smoker -- 0.5 packs/day for 25 years  . Types: Cigarettes  . Quit date: 06/22/2005  Smokeless tobacco  . Never Used    History  Alcohol Use No    Family History  Problem Relation Age of Onset  . Kidney failure  Mother   . Hypertension Mother   . Stroke Mother   . Arthritis Mother     RA  . Cancer Father     bladder cancer  . Atrial fibrillation Brother   . Sleep apnea Brother   . Hypertension Brother   . Breast cancer Sister     Review of Systems: The review of systems is per the HPI.  All other systems were reviewed and are negative.  Physical Exam: BP 120/74  Pulse 76  Ht 5\' 4"  (1.626 m)  Wt 176 lb 12.8 oz (80.196 kg)  BMI 30.35 kg/m2 Patient is very pleasant and in no acute distress. She is a little anxious. She looks much younger than her stated age. Skin is warm and dry. Color is normal.  HEENT is unremarkable. Normocephalic/atraumatic. PERRL. Sclera are nonicteric. Neck is supple. No masses. No JVD. Lungs are clear. Cardiac exam shows an irregular rhythm. Her rate is controlled. Abdomen is soft. Extremities are without edema. Gait and ROM are intact. No gross neurologic deficits noted.  LABORATORY DATA: EKG from earlier today showed atrial fib with a  controlled ventricular response.    Lab Results  Component Value Date   WBC 5.7 01/21/2009   HGB 14.6 01/22/2010   HCT 43.0 01/22/2010   PLT 256 01/21/2009   GLUCOSE 92 10/12/2011   CHOL 141 10/12/2011   TRIG 58.0 10/12/2011   HDL 54.40 10/12/2011   LDLDIRECT 156.6 10/17/2010   LDLCALC 75 10/12/2011   ALT 18 10/12/2011   AST 20 10/12/2011   NA 140 10/12/2011   K 4.1 10/12/2011   CL 106 10/12/2011   CREATININE 0.7 10/12/2011   BUN 15 10/12/2011   CO2 30 10/12/2011   \  Assessment / Plan:

## 2011-11-19 NOTE — Patient Instructions (Addendum)
We need to check some labs today  We are going to put you on Xarelto 20 mg daily - this is to reduce your risk of stroke - this prescription is at the drug store  We are going to increase your Toprol to two times a day - this prescription is at the drug store  Stop your Aspirin on Thursday. Use only Tylenol for pain  We are going to arrange for an ultrasound of your heart - this will tell us about your pumping function and how big the top chambers of your heart are and further define out treatment plan for you  Monitor your blood pressure at home - keep a diary  Dr. Patty Sermons will see you in 3 weeks - if you are still out of rhythm, we will discuss cardioversion (shocking your heart)  Activity level is how you feel and if you feel bad, then stop that activity  Call the [facility] office at [phone] if you have any questions, problems or concerns.

## 2011-11-19 NOTE — Assessment & Plan Note (Addendum)
She is back in atrial fib since Saturday. She is symptomatic. I have increased her Toprol to BID. We are going to update her echo. She may require antiarrhythmic therapy based on those results. We have discussed the various anticoagulants in depth. We are starting Xarelto 20 mg each evening with supper and checking baseline labs today. She may require cardioversion or hopefully, will convert back on her own. She will monitor her pulse and blood pressure at home. Her activity is to be as tolerated. We will see her back in 3 weeks with repeat EKG on return. Patient is agreeable to this plan and will call if any problems develop in the interim.   Addendum: She presents on Tuesday for her echo. She is back in sinus. She would like to hold on the Xarelto and we will use full dose aspirin instead. We will see how the echo turns out and then decide if she needs to come back and see Dr. Patty Sermons. We will keep her on the extra Toprol. She will continue to monitor her pulse at home. We will be in touch after her echo. Patient is agreeable to this plan and will call if any problems develop in the interim.

## 2011-11-20 ENCOUNTER — Ambulatory Visit (HOSPITAL_COMMUNITY): Payer: Medicare Other | Attending: Cardiovascular Disease | Admitting: Radiology

## 2011-11-20 ENCOUNTER — Other Ambulatory Visit: Payer: Self-pay

## 2011-11-20 ENCOUNTER — Encounter: Payer: Self-pay | Admitting: *Deleted

## 2011-11-20 ENCOUNTER — Ambulatory Visit (INDEPENDENT_AMBULATORY_CARE_PROVIDER_SITE_OTHER): Payer: Medicare Other | Admitting: *Deleted

## 2011-11-20 ENCOUNTER — Telehealth: Payer: Self-pay | Admitting: *Deleted

## 2011-11-20 DIAGNOSIS — I4891 Unspecified atrial fibrillation: Secondary | ICD-10-CM

## 2011-11-20 DIAGNOSIS — E782 Mixed hyperlipidemia: Secondary | ICD-10-CM

## 2011-11-20 DIAGNOSIS — L03119 Cellulitis of unspecified part of limb: Secondary | ICD-10-CM

## 2011-11-20 DIAGNOSIS — E663 Overweight: Secondary | ICD-10-CM

## 2011-11-20 DIAGNOSIS — L02619 Cutaneous abscess of unspecified foot: Secondary | ICD-10-CM

## 2011-11-20 NOTE — ED Provider Notes (Signed)
Order(s) created erroneously. Erroneous order ID: [id number] Order moved by: PETERS, MARSHA B Order move date/time: 11/20/2011  3:50 PM Source Patient:    [id number] Source Contact: 11/19/2011 Destination Patient:   [id number] Destination Contact: 02/12/2011

## 2011-11-20 NOTE — Telephone Encounter (Signed)
Message copied by Burnell Blanks on Tue Nov 20, 2011 11:47 AM ------      Message from: Mariane Masters D      Created: Mon Nov 19, 2011  1:05 PM      Regarding: APPOINTMENT        11/19/11  Juliette Alcide, If at all possible please call Mrs.Shepheard with an appointment on 12/07/11.                  Thanks

## 2011-11-20 NOTE — ED Provider Notes (Signed)
Order(s) created erroneously. Erroneous order ID: [id number] Order moved by: PETERS, MARSHA B Order move date/time: 11/20/2011  3:51 PM Source Patient:    [id number] Source Contact: 11/19/2011 Destination Patient:   [id number] Destination Contact: 02/12/2011

## 2011-11-20 NOTE — Telephone Encounter (Signed)
Left message to call back  

## 2011-11-20 NOTE — ED Provider Notes (Signed)
Order(s) created erroneously. Erroneous order ID: [id number] Order moved by: PETERS, MARSHA B Order move date/time: 11/20/2011  3:51 PM Source Patient:    [id number] Source Contact: 11/19/2011 Destination Patient:   [id number] Destination Contact: 02/12/2011

## 2011-11-20 NOTE — Patient Instructions (Signed)
Received fax form from pharmacy for PA on Xeralto.  Norma Fredrickson, NP signed authorization and it was faxed this morning.  Awaiting approval.  Patient seen and prescribed for 11/19/2011.  Vista Mink, CMA

## 2011-11-21 LAB — CBC WITH DIFFERENTIAL/PLATELET
Basophils Absolute: 0 10*3/uL (ref 0.0–0.1)
Basophils Relative: 0.2 % (ref 0.0–3.0)
Eosinophils Absolute: 0.2 10*3/uL (ref 0.0–0.7)
Eosinophils Relative: 2.6 % (ref 0.0–5.0)
HCT: 40.7 % (ref 36.0–46.0)
Hemoglobin: 13.3 g/dL (ref 12.0–15.0)
Lymphocytes Relative: 23.9 % (ref 12.0–46.0)
Lymphs Abs: 2.3 10*3/uL (ref 0.7–4.0)
MCHC: 32.7 g/dL (ref 30.0–36.0)
MCV: 95.4 fl (ref 78.0–100.0)
Monocytes Absolute: 0.8 10*3/uL (ref 0.1–1.0)
Monocytes Relative: 8.7 % (ref 3.0–12.0)
Neutro Abs: 6.2 10*3/uL (ref 1.4–7.7)
Neutrophils Relative %: 64.6 % (ref 43.0–77.0)
Platelets: 289 10*3/uL (ref 150.0–400.0)
RBC: 4.26 Mil/uL (ref 3.87–5.11)
RDW: 13.8 % (ref 11.5–14.6)
WBC: 9.6 10*3/uL (ref 4.5–10.5)

## 2011-11-21 LAB — BASIC METABOLIC PANEL
BUN: 19 mg/dL (ref 6–23)
CO2: 28 mEq/L (ref 19–32)
Calcium: 9.5 mg/dL (ref 8.4–10.5)
Chloride: 102 mEq/L (ref 96–112)
Creatinine, Ser: 1.2 mg/dL (ref 0.4–1.2)
GFR: 49.1 mL/min — ABNORMAL LOW (ref >60.00)
Glucose, Bld: 102 mg/dL — ABNORMAL HIGH (ref 70–99)
Potassium: 4.5 mEq/L (ref 3.5–5.1)
Sodium: 138 mEq/L (ref 135–145)

## 2011-11-21 LAB — TSH: TSH: 2.71 u[IU]/mL (ref 0.35–5.50)

## 2011-11-21 NOTE — Progress Notes (Signed)
Echocardiogram performed on 07/16/ 2013.

## 2011-11-28 ENCOUNTER — Telehealth: Payer: Self-pay | Admitting: *Deleted

## 2011-11-28 NOTE — Telephone Encounter (Signed)
Message copied by Burnell Blanks on Wed Nov 28, 2011  4:18 PM ------      Message from: Cassell Clement      Created: Mon Nov 26, 2011  9:42 AM       We can see her back in several weeks after her beach trip to be sure she is doing okay on her higher dose of beta blocker

## 2011-11-28 NOTE — Telephone Encounter (Signed)
Advised and schedule follow up appointmnet

## 2011-11-28 NOTE — Telephone Encounter (Signed)
Patient in [facility] and  Dr. Patty Sermons reviewed Echo and ok to just see when back from vacation. Scheduled appointment

## 2011-12-10 ENCOUNTER — Other Ambulatory Visit: Payer: Self-pay | Admitting: *Deleted

## 2011-12-27 ENCOUNTER — Ambulatory Visit: Payer: Medicare Other | Admitting: Family Medicine

## 2012-01-01 ENCOUNTER — Encounter: Payer: Self-pay | Admitting: Cardiology

## 2012-01-01 ENCOUNTER — Ambulatory Visit (INDEPENDENT_AMBULATORY_CARE_PROVIDER_SITE_OTHER): Payer: Medicare Other | Admitting: Cardiology

## 2012-01-01 VITALS — BP 138/66 | HR 51 | Ht 64.0 in | Wt 171.0 lb

## 2012-01-01 DIAGNOSIS — I4891 Unspecified atrial fibrillation: Secondary | ICD-10-CM

## 2012-01-01 DIAGNOSIS — I119 Hypertensive heart disease without heart failure: Secondary | ICD-10-CM

## 2012-01-01 DIAGNOSIS — E7800 Pure hypercholesterolemia, unspecified: Secondary | ICD-10-CM

## 2012-01-01 DIAGNOSIS — E782 Mixed hyperlipidemia: Secondary | ICD-10-CM

## 2012-01-01 DIAGNOSIS — I480 Paroxysmal atrial fibrillation: Secondary | ICD-10-CM

## 2012-01-01 NOTE — Assessment & Plan Note (Signed)
The patient has had no recurrence of her atrial fibrillation.  She thinks that her episodes often have occurred in the past after she has eaten too much and has a stomach feels too full.  She is making an effort not to overeat and she feels better.

## 2012-01-01 NOTE — Assessment & Plan Note (Signed)
The patient has a history of hypercholesterolemia and is on simvastatin 20 mg daily.  She is watching her diet better and her weight is down 5 pounds.  We will plan to get fasting labs works at her next visit in December.

## 2012-01-01 NOTE — Assessment & Plan Note (Signed)
She brought in a list of her home blood pressures which have been satisfactory.  We reviewed those today

## 2012-01-01 NOTE — Patient Instructions (Addendum)
Your physician recommends that you continue on your current medications as directed. Please refer to the Current Medication list given to you today.  Your physician recommends that you schedule a follow-up appointment in: December office visit with fasting labs (lp/bmet/hfp)

## 2012-01-01 NOTE — Progress Notes (Signed)
Autumn Moss Date of Birth:  Sep 15, 1940 [facility] [address] [place], [place]  [address] [phone]         Fax   [phone]  History of Present Illness: This pleasant 71 year old woman is seen for a scheduled 6 month followup office visit. She has a history of palpitations and atypical chest pain. She also has a past history of paroxysmal atrial fibrillation. She has a history of hypercholesterolemia. She does not have any history of ischemic heart disease. She has not had an ischemic testing. She did have an echocardiogram in September 2010 showing an ejection fraction of 55-60% with normal diastolic function and mild aortic sclerosis with mild aortic insufficiency and mild mitral regurgitation. In July 2013 the patient presented with recurrent atrial fibrillation.  Her Toprol was increased to 50 mg twice a day and she was placed on Xarelto.  When she returned today later for her echocardiogram she was back in sinus rhythm and her Xarelto was stopped and she was advised to go back on just one Toprol daily.  She has had no further episodes of paroxysmal atrial fibrillation since that time.  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  Current Outpatient Prescriptions  Medication Sig Dispense Refill  . aspirin 81 MG tablet Take 81 mg by mouth daily.        Marland Kitchen CALCIUM PO Take 500 mg by mouth daily.       . digoxin (LANOXIN) 0.125 MG tablet TAKE 1 TABLET EVERY DAY  30 tablet  11  . Lutein 6 MG CAPS Take 12 mg by mouth daily.       . metoprolol (LOPRESSOR) 50 MG tablet Take 1 tablet (50 mg total) by mouth as needed.  30 tablet  12  . metoprolol succinate (TOPROL-XL) 50 MG 24 hr tablet Take 50 mg by mouth daily.      . multivitamin (THERAGRAN) per tablet Take 1 tablet by mouth daily.        Marland Kitchen omeprazole (PRILOSEC) 40 MG capsule TAKE ONE CAPSULE BY MOUTH DAILY   30 capsule  5  . Rivaroxaban (XARELTO) 20 MG TABS Take 1 tablet (20 mg total) by mouth daily with supper.  30 tablet  6  . simvastatin (ZOCOR) 20 MG tablet TAKE 1 TABLET BY MOUTH EVERY EVENING  30 tablet  5  . DISCONTD: metoprolol succinate (TOPROL-XL) 50 MG 24 hr tablet Take 1 tablet (50 mg total) by mouth 2 (two) times daily.  60 tablet  11    No Known Allergies  Patient Active Problem List  Diagnosis  . Mixed hyperlipidemia  . OVERWEIGHT  . Atrial fibrillation  . MEASLES, HX OF  . CHICKENPOX, HX OF  . Sinusitis acute  . Neuroma, Morton's  . Cellulitis of left foot    History  Smoking status  . Former Smoker -- 0.5 packs/day for 25 years  . Types: Cigarettes  . Quit date: 06/22/2005  Smokeless tobacco  . Never Used    History  Alcohol Use No    Family History  Problem Relation Age of Onset  . Kidney failure Mother   . Hypertension Mother   . Stroke Mother   . Arthritis Mother     RA  . Cancer Father     bladder cancer  . Atrial fibrillation Brother   . Sleep apnea Brother   . Hypertension Brother   . Breast  cancer Sister     Review of Systems: Constitutional: no fever chills diaphoresis or fatigue or change in weight.  Head and neck: no hearing loss, no epistaxis, no photophobia or visual disturbance. Respiratory: No cough, shortness of breath or wheezing. Cardiovascular: No chest pain peripheral edema, palpitations. Gastrointestinal: No abdominal distention, no abdominal pain, no change in bowel habits hematochezia or melena. Genitourinary: No dysuria, no frequency, no urgency, no nocturia. Musculoskeletal:No arthralgias, no back pain, no gait disturbance or myalgias. Neurological: No dizziness, no headaches, no numbness, no seizures, no syncope, no weakness, no tremors. Hematologic: No lymphadenopathy, no easy bruising. Psychiatric: No confusion, no hallucinations, no sleep disturbance.    Physical Exam: Filed Vitals:   01/01/12 1408  BP: 138/66    Pulse: 51   the general appearance reveals a well-developed well-nourished woman in no distress.Pupils equal and reactive.   Extraocular Movements are full.  There is no scleral icterus.  The mouth and pharynx are normal.  The neck is supple.  The carotids reveal no bruits.  The jugular venous pressure is normal.  The thyroid is not enlarged.  There is no lymphadenopathy.  The chest is clear to percussion and auscultation. There are no rales or rhonchi. Expansion of the chest is symmetrical.  Heart reveals grade 2/6 systolic ejection murmur at the base.The abdomen is soft and nontender. Bowel sounds are normal. The liver and spleen are not enlarged. There Are no abdominal masses. There are no bruits.  The pedal pulses are good.  There is no phlebitis or edema.  There is no cyanosis or clubbing. Strength is normal and symmetrical in all extremities.  There is no lateralizing weakness.  There are no sensory deficits.  The skin is warm and dry.  There is no rash.    Assessment / Plan:  Continue same medication.  Recheck in December for followup office visit lipid panel hepatic function panel and basal metabolic panel

## 2012-01-06 DIAGNOSIS — G459 Transient cerebral ischemic attack, unspecified: Secondary | ICD-10-CM

## 2012-01-06 HISTORY — DX: Transient cerebral ischemic attack, unspecified: G45.9

## 2012-01-17 ENCOUNTER — Other Ambulatory Visit: Payer: Self-pay | Admitting: *Deleted

## 2012-01-17 DIAGNOSIS — I4891 Unspecified atrial fibrillation: Secondary | ICD-10-CM

## 2012-01-17 MED ORDER — DIGOXIN 125 MCG PO TABS: 0.1250 mg | ORAL_TABLET | Freq: Every day | ORAL | Status: DC

## 2012-01-17 MED ORDER — OMEPRAZOLE 40 MG PO CPDR: 40.0000 mg | DELAYED_RELEASE_CAPSULE | Freq: Every day | ORAL | Status: DC

## 2012-01-17 NOTE — Telephone Encounter (Signed)
Refilled omeprazole and digoxin

## 2012-01-24 ENCOUNTER — Telehealth: Payer: Self-pay | Admitting: *Deleted

## 2012-01-24 NOTE — Telephone Encounter (Signed)
Xarelto approved until 12/25/12 #[phone]

## 2012-02-05 ENCOUNTER — Telehealth: Payer: Self-pay | Admitting: Family Medicine

## 2012-02-05 NOTE — Telephone Encounter (Signed)
Patient in [place] s/p stroke with mild deficits. She is admitted there and may follow up here upon release but Dr Kristine Linea is aware of patient's intermittent compliance with follow up.

## 2012-02-14 ENCOUNTER — Encounter: Payer: Self-pay | Admitting: Nurse Practitioner

## 2012-02-14 ENCOUNTER — Telehealth: Payer: Self-pay | Admitting: Cardiology

## 2012-02-14 ENCOUNTER — Ambulatory Visit (INDEPENDENT_AMBULATORY_CARE_PROVIDER_SITE_OTHER): Payer: Medicare Other | Admitting: Nurse Practitioner

## 2012-02-14 VITALS — BP 136/68 | HR 67 | Ht 64.0 in | Wt 163.4 lb

## 2012-02-14 DIAGNOSIS — G459 Transient cerebral ischemic attack, unspecified: Secondary | ICD-10-CM

## 2012-02-14 MED ORDER — METOPROLOL SUCCINATE ER 25 MG PO TB24: ORAL_TABLET | ORAL | Status: DC

## 2012-02-14 MED ORDER — METOPROLOL TARTRATE 25 MG PO TABS: 12.5000 mg | ORAL_TABLET | Freq: Two times a day (BID) | ORAL | Status: DC

## 2012-02-14 NOTE — Telephone Encounter (Signed)
Patient phoned stating that Rx for Metoprolol Tart instead of Metoprolol Suc (thought tartrate was what she was taking) was sent in to her pharmacy.  She does use the tartrate PRN.  Called in correct Rx as requested   Blood pressure at home 131/68 heart rate 52 sitting and standing 114/63 heart rate 54. States she will continue to monitor Will forward to [place] NP for review

## 2012-02-14 NOTE — Patient Instructions (Signed)
Check your blood pressure later today and call Juliette Alcide with the readings  Monitor your blood pressure at home. Goal is to be less than 135/85 consistently.  Stay on your current medicines  You may use an extra 25 mg of Metoprolol Tartrate if needed if you go out of rhythm  We will see you back in December  Call the [facility] office at [phone] if you have any questions, problems or concerns.

## 2012-02-14 NOTE — Telephone Encounter (Signed)
Advised patient

## 2012-02-14 NOTE — Telephone Encounter (Signed)
Noted. Please have her call in a week or so with an update on her blood pressure .

## 2012-02-14 NOTE — Progress Notes (Addendum)
Dagoberto Reef Date of Birth: 29-Sep-1940 Medical Record #[id number]  History of Present Illness: Autumn Moss is seen back today for a post hospital visit. She is seen for Dr. Patty Sermons. She has a history of PAF and bradycardia. Other problems include HLD and GERD. She has been on chronic beta blocker and digoxin for many years. She has bradycardia. No history of CAD. Last echo was in July and showed normal EF.   She comes in today. She is here alone. She was at [facility] at the end of September. Had the onset of cramps and right sided weakness. Went to the hospital at [facility] and was diagnosed with a TIA. She had complete recovery and resolution of her symptoms. Xarelto was added back and she will now be on chronically. She feels fine. Had complete testing and her records are reviewed. 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 She was bradycardic and her beta blocker was cut way back. Says her blood pressure at home has been great. It is elevated today. She does not have chest pain. Says her rhythm has been ok "she thinks".   Current Outpatient Prescriptions on File Prior to Visit  Medication Sig Dispense Refill  . aspirin 81 MG tablet Take 81 mg by mouth daily.        Marland Kitchen CALCIUM PO Take 500 mg by mouth daily.       . digoxin (LANOXIN) 0.125 MG tablet Take 1 tablet (0.125 mg total) by mouth daily.  90 tablet  3  . Lutein 6 MG CAPS Take 12 mg by mouth daily.       . multivitamin (THERAGRAN) per tablet Take 1 tablet by mouth daily.        Marland Kitchen omeprazole (PRILOSEC) 40 MG capsule Take 1 capsule (40 mg total) by mouth daily.  90 capsule  3  . Rivaroxaban (XARELTO) 20 MG TABS Take 1 tablet (20 mg total) by mouth daily with supper.  30 tablet  6  . simvastatin (ZOCOR) 20 MG tablet TAKE 1 TABLET BY MOUTH EVERY EVENING  30 tablet  5    No Known Allergies  Past Medical History    Diagnosis Date  . Hypertension   . Paroxysmal atrial fibrillation   . Palpitations     sporadic  . Hyperlipidemia   . Aortic valve disease     mild - 07/05/05 per echocardiogram   . Left ventricular diastolic dysfunction     mild - 07/05/05 per echocardiogram  . COPD (chronic obstructive pulmonary disease)   . Ectopic pregnancy     history of x2  . Dyspepsia     occasional  . History of echocardiogram 01/26/2009    Est. EF of 55-60%  --  MILD MITRAL REGURGITATION  -  SINCE LAST ECHO OF 07/10/05 NO SIGNIFICANT CHANGES.  ---  Clovis Pu Brackbill, MD  . Sinusitis acute 04/10/2011  . 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 Chronic anticoagulation     Xarelto started November 19, 2011    Past Surgical History  Procedure Date  . Ectopic pregnancy surgery     bilateral, both tubes excised and 1 ovary excised  . Oophorectomy   . Appendectomy     History  Smoking status  . Former Smoker -- 0.5 packs/day for 25 years  . Types: Cigarettes  . Quit date: 06/22/2005  Smokeless tobacco  .  Never Used    History  Alcohol Use No    Family History  Problem Relation Age of Onset  . Kidney failure Mother   . Hypertension Mother   . Stroke Mother   . Arthritis Mother     RA  . Cancer Father     bladder cancer  . Atrial fibrillation Brother   . Sleep apnea Brother   . Hypertension Brother   . Breast cancer Sister     Review of Systems: The review of systems is per the HPI.  All other systems were reviewed and are negative.  Physical Exam: BP 136/68  Pulse 67  Ht 5\' 4"  (1.626 m)  Wt 163 lb 6.4 oz (74.118 kg)  BMI 28.05 kg/m2 Patient is very pleasant and in no acute distress. Skin is warm and dry. Color is normal.  HEENT is unremarkable. Normocephalic/atraumatic. PERRL. Sclera are nonicteric. Neck is supple. No masses. No JVD. Lungs are clear. Cardiac exam shows a regular rate and rhythm. Abdomen is soft. Extremities are without edema. Gait and ROM are intact. No gross neurologic deficits  noted.  LABORATORY DATA: Records are reviewed.   Assessment / Plan: 1. 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 She has no residual neurologic deficits.  2. PAF - on very low dose beta blocker due to the bradycardia. If this continues to be an issue, antiarrhythmic therapy may need to be considered as well as possible PTVP implant.   3. Elevated BP - she says her blood pressure has been good at home. I have asked her to continue to monitor and call with an update later this week.  4. 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  We will tentatively see her back in December as planned.   Patient is agreeable to this plan and will call if any problems develop in the interim.   We did discuss also today the possibility of OSA. She has never had a sleep study. Not really fatigued in the morning after waking up. Will discuss further at her next visit.

## 2012-02-14 NOTE — Telephone Encounter (Signed)
Pt doesn't think her medication she got today was called in correctly and she has more information for [employer]

## 2012-02-26 ENCOUNTER — Encounter: Payer: Self-pay | Admitting: Cardiology

## 2012-04-14 ENCOUNTER — Other Ambulatory Visit: Payer: Self-pay

## 2012-04-14 MED ORDER — SIMVASTATIN 20 MG PO TABS: 20.0000 mg | ORAL_TABLET | Freq: Every day | ORAL | Status: DC

## 2012-04-24 ENCOUNTER — Encounter: Payer: Self-pay | Admitting: Cardiology

## 2012-04-24 ENCOUNTER — Ambulatory Visit (INDEPENDENT_AMBULATORY_CARE_PROVIDER_SITE_OTHER): Payer: Medicare Other | Admitting: Cardiology

## 2012-04-24 ENCOUNTER — Other Ambulatory Visit (INDEPENDENT_AMBULATORY_CARE_PROVIDER_SITE_OTHER): Payer: Medicare Other

## 2012-04-24 VITALS — BP 122/62 | HR 54 | Ht 64.0 in | Wt 157.0 lb

## 2012-04-24 DIAGNOSIS — E663 Overweight: Secondary | ICD-10-CM

## 2012-04-24 DIAGNOSIS — E782 Mixed hyperlipidemia: Secondary | ICD-10-CM

## 2012-04-24 DIAGNOSIS — E7800 Pure hypercholesterolemia, unspecified: Secondary | ICD-10-CM

## 2012-04-24 DIAGNOSIS — I4891 Unspecified atrial fibrillation: Secondary | ICD-10-CM

## 2012-04-24 DIAGNOSIS — I480 Paroxysmal atrial fibrillation: Secondary | ICD-10-CM

## 2012-04-24 LAB — LIPID PANEL
LDL Cholesterol: 78 mg/dL (ref 0–99)
Total CHOL/HDL Ratio: 3
VLDL: 13.2 mg/dL (ref 0.0–40.0)

## 2012-04-24 LAB — BASIC METABOLIC PANEL
BUN: 13 mg/dL (ref 6–23)
CO2: 29 mEq/L (ref 19–32)
Chloride: 103 mEq/L (ref 96–112)
Glucose, Bld: 93 mg/dL (ref 70–99)
Potassium: 4.1 mEq/L (ref 3.5–5.1)

## 2012-04-24 LAB — HEPATIC FUNCTION PANEL
Alkaline Phosphatase: 87 U/L (ref 39–117)
Bilirubin, Direct: 0 mg/dL (ref 0.0–0.3)
Total Bilirubin: 0.5 mg/dL (ref 0.3–1.2)

## 2012-04-24 MED ORDER — OMEPRAZOLE 20 MG PO CPDR: 20.0000 mg | DELAYED_RELEASE_CAPSULE | Freq: Two times a day (BID) | ORAL | Status: DC

## 2012-04-24 NOTE — Progress Notes (Signed)
Autumn Moss Date of Birth:  Jan 14, 1941 [facility] [address] [place], [place]  [address] [phone]         Fax   [phone]  History of Present Illness: This pleasant 71 year old woman is seen for a scheduled 6 month followup office visit. She has a history of palpitations and atypical chest pain. She also has a past history of paroxysmal atrial fibrillation. She has a history of hypercholesterolemia. She does not have any history of ischemic heart disease. She has not had an ischemic testing. She did have an echocardiogram in September 2010 showing an ejection fraction of 55-60% with normal diastolic function and mild aortic sclerosis with mild aortic insufficiency and mild mitral regurgitation.  In July 2013 the patient presented with recurrent atrial fibrillation. Her Toprol was increased to 50 mg twice a day and she was placed on Xarelto. When she returned today later for her echocardiogram she was back in sinus rhythm and her Xarelto was stopped and she was advised to go back on just one Toprol daily. She has had no further episodes of paroxysmal atrial fibrillation since that time. 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 In late September 2013 the patient went to the hospital at [facility] and was diagnosed with a TIA.  She had complete recovery and resolution of her symptoms.  Xarelto was added back at that time.  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  She was bradycardic and her beta blocker was cut way back at that time.  Current Outpatient Prescriptions  Medication Sig Dispense Refill  . aspirin 81 MG tablet Take 81 mg by mouth daily.        Marland Kitchen CALCIUM PO Take 500 mg by mouth daily.       . digoxin (LANOXIN) 0.125 MG  tablet Take 1 tablet (0.125 mg total) by mouth daily.  90 tablet  3  . Lutein 20 MG CAPS Take 1 capsule by mouth daily.      . metoprolol succinate (TOPROL-XL) 25 MG 24 hr tablet Take 25 mg by mouth as needed. 1/2 tablet twice a day      . multivitamin (THERAGRAN) per tablet Take 1 tablet by mouth daily.        Marland Kitchen omeprazole (PRILOSEC) 20 MG capsule Take 1 capsule (20 mg total) by mouth 2 (two) times daily.  180 capsule  3  . Rivaroxaban (XARELTO) 20 MG TABS Take 1 tablet (20 mg total) by mouth daily with supper.  30 tablet  6  . simvastatin (ZOCOR) 20 MG tablet Take 1 tablet (20 mg total) by mouth daily.  90 tablet  0    No Known Allergies  Patient Active Problem List  Diagnosis  . Mixed hyperlipidemia  . OVERWEIGHT  . Atrial fibrillation  . MEASLES, HX OF  . CHICKENPOX, HX OF  . Sinusitis acute  . Neuroma, Morton's  . Cellulitis of left foot  . Benign hypertensive heart disease without heart failure    History  Smoking status  . Former Smoker -- 0.5 packs/day for 25 years  . Types: Cigarettes  . Quit date: 06/22/2005  Smokeless tobacco  . Never Used    History  Alcohol Use No    Family History  Problem Relation Age of  Onset  . Kidney failure Mother   . Hypertension Mother   . Stroke Mother   . Arthritis Mother     RA  . Cancer Father     bladder cancer  . Atrial fibrillation Brother   . Sleep apnea Brother   . Hypertension Brother   . Breast cancer Sister     Review of Systems: Constitutional: no fever chills diaphoresis or fatigue or change in weight.  Head and neck: no hearing loss, no epistaxis, no photophobia or visual disturbance. Respiratory: No cough, shortness of breath or wheezing. Cardiovascular: No chest pain peripheral edema, palpitations. Gastrointestinal: No abdominal distention, no abdominal pain, no change in bowel habits hematochezia or melena. Genitourinary: No dysuria, no frequency, no urgency, no nocturia. Musculoskeletal:No arthralgias,  no back pain, no gait disturbance or myalgias. Neurological: No dizziness, no headaches, no numbness, no seizures, no syncope, no weakness, no tremors. Hematologic: No lymphadenopathy, no easy bruising. Psychiatric: No confusion, no hallucinations, no sleep disturbance.    Physical Exam: Filed Vitals:   04/24/12 0858  BP: 122/62  Pulse: 54   general appearance reveals a well-developed well-nourished woman in no distress.The head and neck exam reveals pupils equal and reactive.  Extraocular movements are full.  There is no scleral icterus.  The mouth and pharynx are normal.  The neck is supple.  The carotids reveal no bruits.  The jugular venous pressure is normal.  The  thyroid is not enlarged.  There is no lymphadenopathy.  The chest is clear to percussion and auscultation.  There are no rales or rhonchi.  Expansion of the chest is symmetrical.  The precordium is quiet.  The first heart sound is normal.  The second heart sound is physiologically split.  There is no murmur gallop rub or click.  There is no abnormal lift or heave.  The abdomen is soft and nontender.  The bowel sounds are normal.  The liver and spleen are not enlarged.  There are no abdominal masses.  There are no abdominal bruits.  Extremities reveal good pedal pulses.  There is no phlebitis or edema.  There is no cyanosis or clubbing.  Strength is normal and symmetrical in all extremities.  There is no lateralizing weakness.  There are no sensory deficits.  The skin is warm and dry.  There is no rash.  EKG today shows sinus bradycardia at 52 per minute and no premature beats and no ischemic changes   Assessment / Plan: Continue same medication.  She is requesting a change in her heart tone pump inhibitor.  She will now take Prilosec 20 mg twice a day. In regard to her nocturnal onset of paroxysmal atrial fibrillation we will evaluate the question of sleep apnea.  We will refer her to pulmonary. Return here in 4 months for  followup office visit lipid panel hepatic function panel and basal metabolic panel

## 2012-04-24 NOTE — Assessment & Plan Note (Signed)
The patient has had no further TIA symptoms.  She has had 2 brief episodes of atrial fibrillation since September, both of which lasted less than 2 hours.  She states that her episodes always seem to start at about 3:00 in the morning raising the question of possible sleep apnea correlation.  Her brother has sleep apnea.  The patient herself is not known to snore.

## 2012-04-24 NOTE — Assessment & Plan Note (Signed)
The patient has been exercising more and has been watching her diet and her weight is down 6 pounds since last visit

## 2012-04-24 NOTE — Assessment & Plan Note (Signed)
The patient has a history of hypercholesterolemia.  She is on simvastatin 20 mg daily.  She's not having any myalgias.

## 2012-04-24 NOTE — Patient Instructions (Signed)
Will arrange for you to see [facility]   Will change your Prilosec to 20 mg twice a day  Your physician wants you to follow-up in: 4 months with fasting labs (lp/bmet/hfp) You will receive a reminder letter in the mail two months in advance. If you don't receive a letter, please call our office to schedule the follow-up appointment.

## 2012-04-24 NOTE — Progress Notes (Signed)
Quick Note:  Please report to patient. The recent labs are stable. Continue same medication and careful diet. ______ 

## 2012-04-29 ENCOUNTER — Telehealth: Payer: Self-pay | Admitting: Cardiology

## 2012-04-29 NOTE — Telephone Encounter (Signed)
Message copied by Burnell Blanks on Tue Apr 29, 2012 12:09 PM ------      Message from: Cassell Clement      Created: Thu Apr 24, 2012  4:40 PM       Please report to patient.  The recent labs are stable. Continue same medication and careful diet.

## 2012-04-29 NOTE — Telephone Encounter (Signed)
Advised patient of lab results  Patient will call and reschedule ov with Dr Maple Hudson

## 2012-04-29 NOTE — Telephone Encounter (Signed)
New Problem:    Paient called returning your call.  Please call back.

## 2012-05-16 ENCOUNTER — Ambulatory Visit: Payer: Self-pay

## 2012-05-16 ENCOUNTER — Ambulatory Visit (INDEPENDENT_AMBULATORY_CARE_PROVIDER_SITE_OTHER): Payer: Self-pay | Admitting: Family Medicine

## 2012-05-16 ENCOUNTER — Encounter: Payer: Self-pay | Admitting: Family Medicine

## 2012-05-16 ENCOUNTER — Ambulatory Visit (HOSPITAL_BASED_OUTPATIENT_CLINIC_OR_DEPARTMENT_OTHER)
Admission: RE | Admit: 2012-05-16 | Discharge: 2012-05-16 | Disposition: A | Discharge status: Home / Self Care | Payer: No Typology Code available for payment source | Source: Ambulatory Visit | Attending: Family Medicine | Admitting: Family Medicine

## 2012-05-16 VITALS — BP 111/65 | HR 59 | Temp 98.6°F | Ht 64.0 in | Wt 158.0 lb

## 2012-05-16 DIAGNOSIS — T148XXA Other injury of unspecified body region, initial encounter: Secondary | ICD-10-CM

## 2012-05-16 DIAGNOSIS — M25569 Pain in unspecified knee: Secondary | ICD-10-CM | POA: Insufficient documentation
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
## 2012-05-18 DIAGNOSIS — T148XXA Other injury of unspecified body region, initial encounter: Secondary | ICD-10-CM | POA: Insufficient documentation

## 2012-05-18 NOTE — Assessment & Plan Note (Signed)
Right knee and left mid tibia regions with very tendern contusions. Check x-rays of these areas today to r/o fx.   Tylenol 1000mg  q6h prn.  She declined stronger pain med today.
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
## 2012-05-19 ENCOUNTER — Ambulatory Visit (INDEPENDENT_AMBULATORY_CARE_PROVIDER_SITE_OTHER): Payer: Medicare Other | Admitting: Family Medicine

## 2012-05-19 ENCOUNTER — Encounter: Payer: Self-pay | Admitting: Family Medicine

## 2012-05-19 VITALS — BP 147/72 | HR 57 | Ht 64.0 in | Wt 158.0 lb

## 2012-05-19 DIAGNOSIS — S8012XA Contusion of left lower leg, initial encounter: Secondary | ICD-10-CM

## 2012-05-19 DIAGNOSIS — S8010XA Contusion of unspecified lower leg, initial encounter: Secondary | ICD-10-CM

## 2012-05-19 NOTE — Assessment & Plan Note (Signed)
Prominent pain and soft tissue swelling in anterior half of entire left lower leg--more than I would expect for her mechanism of injury.  She has minimal ability to even ambulate on this leg due to the pain.  Plain films were reassuring regarding lack of bony injury. However, with her prominent symptoms and the fact that she is on ASA and xarelto, I would like a orthopedist's opinion regarding whether or not any further imaging is needed to assess deeper soft tissue injury/hematoma.  No signs of compartment syndrome. DVT doubtful explanation for her prominent swelling due to the fact that she is on xarelto and ASA. Referral ordered for [facility] today. Continue ice, elevation, tylenol prn.
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
## 2012-05-20 ENCOUNTER — Ambulatory Visit: Payer: Medicare Other | Admitting: Family Medicine

## 2012-05-29 ENCOUNTER — Telehealth: Payer: Self-pay | Admitting: Cardiology

## 2012-05-29 NOTE — Telephone Encounter (Signed)
Spoke with patient and encouraged her to keep her pulmonary appt and scheduled her recall appt
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
## 2012-06-03 ENCOUNTER — Institutional Professional Consult (permissible substitution): Payer: Medicare Other | Admitting: Internal Medicine

## 2012-06-05 ENCOUNTER — Ambulatory Visit (INDEPENDENT_AMBULATORY_CARE_PROVIDER_SITE_OTHER): Payer: Medicare Other | Admitting: Internal Medicine

## 2012-06-05 ENCOUNTER — Encounter: Payer: Self-pay | Admitting: Internal Medicine

## 2012-06-05 VITALS — BP 110/70 | HR 51 | Ht 66.0 in | Wt 157.8 lb

## 2012-06-05 DIAGNOSIS — S8012XA Contusion of left lower leg, initial encounter: Secondary | ICD-10-CM

## 2012-06-05 DIAGNOSIS — S8010XA Contusion of unspecified lower leg, initial encounter: Secondary | ICD-10-CM

## 2012-06-05 DIAGNOSIS — I4891 Unspecified atrial fibrillation: Secondary | ICD-10-CM

## 2012-06-05 DIAGNOSIS — G4733 Obstructive sleep apnea (adult) (pediatric): Secondary | ICD-10-CM

## 2012-06-05 NOTE — Assessment & Plan Note (Signed)
She has not been aware of symptoms of OSA, but wasn't looking for them. We discussed association between OSA, AFib and HBP Plan NPSG with split protocol. Because of heart disease she won't be accepted for unattended study by most carriers.

## 2012-06-05 NOTE — Assessment & Plan Note (Signed)
Regular pulse consistent with NSR at this visit.
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
## 2012-06-05 NOTE — Assessment & Plan Note (Signed)
Oozing wound left pretibial area was dressed with gauze. She is advised to keep leg elevated until orthopedic appointment tomorrow

## 2012-06-05 NOTE — Patient Instructions (Addendum)
Order- Split protocol NPSG  Dx  OSA  Please call as need

## 2012-06-06 ENCOUNTER — Encounter (HOSPITAL_COMMUNITY): Payer: Medicare Other

## 2012-06-06 ENCOUNTER — Other Ambulatory Visit (HOSPITAL_COMMUNITY): Payer: Self-pay | Admitting: Sports Medicine

## 2012-06-06 DIAGNOSIS — M7989 Other specified soft tissue disorders: Secondary | ICD-10-CM

## 2012-06-06 DIAGNOSIS — M79606 Pain in leg, unspecified: Secondary | ICD-10-CM

## 2012-06-09 ENCOUNTER — Ambulatory Visit (HOSPITAL_COMMUNITY)
Admission: RE | Admit: 2012-06-09 | Discharge: 2012-06-09 | Disposition: A | Discharge status: Home / Self Care | Payer: No Typology Code available for payment source | Source: Ambulatory Visit | Attending: Sports Medicine | Admitting: Sports Medicine

## 2012-06-09 DIAGNOSIS — M79609 Pain in unspecified limb: Secondary | ICD-10-CM | POA: Insufficient documentation

## 2012-06-09 DIAGNOSIS — M79606 Pain in leg, unspecified: Secondary | ICD-10-CM

## 2012-06-09 DIAGNOSIS — M7989 Other specified soft tissue disorders: Secondary | ICD-10-CM

## 2012-06-09 NOTE — Progress Notes (Signed)
*  PRELIMINARY RESULTS* Vascular Ultrasound Left lower extremity venous duplex has been completed.  Preliminary findings: Left:  No evidence of DVT, superficial thrombosis, or Baker's cyst.  Called report to [facility] at Dr. Markus Jarvis office.   Farrel Demark, RDMS, RVT 06/09/2012, 10:49 AM

## 2012-06-26 ENCOUNTER — Ambulatory Visit (HOSPITAL_BASED_OUTPATIENT_CLINIC_OR_DEPARTMENT_OTHER): Payer: Medicare Other | Attending: Internal Medicine | Admitting: Radiology

## 2012-06-26 VITALS — Ht 65.0 in | Wt 157.0 lb

## 2012-06-26 DIAGNOSIS — G4710 Hypersomnia, unspecified: Secondary | ICD-10-CM | POA: Insufficient documentation

## 2012-06-29 DIAGNOSIS — G4710 Hypersomnia, unspecified: Secondary | ICD-10-CM

## 2012-06-29 DIAGNOSIS — G4730 Sleep apnea, unspecified: Secondary | ICD-10-CM

## 2012-06-29 DIAGNOSIS — R0609 Other forms of dyspnea: Secondary | ICD-10-CM

## 2012-06-29 DIAGNOSIS — R0989 Other specified symptoms and signs involving the circulatory and respiratory systems: Secondary | ICD-10-CM

## 2012-06-30 NOTE — Procedures (Signed)
NAME:  Autumn Moss, Autumn Moss            ACCOUNT NO.:  [id number]  MEDICAL RECORD NO.:  [id number]          PATIENT TYPE:  OUT  LOCATION:  SLEEP CENTER                 FACILITY:  [facility]  PHYSICIAN:  Benetta Maclaren D. Maple Hudson, MD, FCCP, FACPDATE OF BIRTH:  07/30/38  DATE OF STUDY:  06/26/2012                           NOCTURNAL POLYSOMNOGRAM  REFERRING PHYSICIAN:  Alvie Speltz D. Tarnesha Ulloa, MD, FCCP, FACP  INDICATION FOR STUDY:  Hypersomnia with sleep apnea.  EPWORTH SLEEPINESS SCORE:  5/24.  BMI 26.1, weight 157 pounds, height 65 inches, neck 12.5 inches.  MEDICATIONS:  Home medications are charted and reviewed.  SLEEP ARCHITECTURE:  Total sleep time 315 minutes with sleep efficiency 74.9%.  Stage I was 2.7%, stage II 81.1%, stage III absent, REM 16.2% of total sleep time.  Sleep latency 87.5 minutes, REM latency 172.5 minutes, awake after sleep onset 18 minutes.  Arousal index 9.3.  BEDTIME MEDICATION:  Simvastatin.  RESPIRATORY DATA:  Apnea-hypopnea index (AHI) 1.9 per hour.  A total of 10 events was scored, all was hypopneas associated with supine sleep position and REM.  REM/AHI 7.1 per hour.  There were not enough events for CPAP titration.  OXYGEN DATA:  Mild snoring with oxygen desaturation to a nadir of 88% and mean oxygen saturation through the study of 92.1% on room air.  CARDIAC DATA:  Sinus rhythm.  MOVEMENT-PARASOMNIA:  A total of 73 limb jerks were counted, of which 3 were associated with arousals or awakening for periodic limb movement with arousal index of 0.6 per hour.  These mostly came in a cluster around 1 a.m.  Bathroom x1.  IMPRESSIONS-RECOMMENDATIONS: 1. Sleep architecture was significant mainly for sleep onset at about     11:45 p.m. 2. Occasional respiratory event with sleep disturbance, within normal     limits.  AHI 1.9 per hour (the normal range     for adults is from 0-5 events per hour).  Mild snoring with oxygen     desaturation to a nadir of 88% and mean oxygen  saturation through     the study of 92.1% on room air.     Berlie Hatchel D. Maple Hudson, MD, [facility], FACP Diplomate, American Board of Sleep Medicine    CDY/MEDQ  D:  06/29/2012 12:51:48  T:  06/30/2012 00:28:30  Job:  [id number]

## 2012-07-10 ENCOUNTER — Encounter: Payer: Self-pay | Admitting: Internal Medicine

## 2012-07-10 ENCOUNTER — Ambulatory Visit (INDEPENDENT_AMBULATORY_CARE_PROVIDER_SITE_OTHER): Payer: Medicare Other | Admitting: Internal Medicine

## 2012-07-10 VITALS — BP 128/70 | HR 50 | Ht 65.0 in | Wt 162.8 lb

## 2012-07-10 DIAGNOSIS — G4733 Obstructive sleep apnea (adult) (pediatric): Secondary | ICD-10-CM

## 2012-07-10 NOTE — Patient Instructions (Addendum)
Please call as needed 
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
## 2012-07-11 NOTE — Assessment & Plan Note (Signed)
I explained that we can't exclude an occasional event. Several instabilities are associated with REM sleep, but her sleep study did not indicate why she would be at unusual risk for a sleep related event. She will continue to work with Dr Patty Sermons. I would be happy to see her again if needed.

## 2012-07-15 ENCOUNTER — Other Ambulatory Visit: Payer: Self-pay | Admitting: *Deleted

## 2012-07-15 MED ORDER — SIMVASTATIN 20 MG PO TABS: 20.0000 mg | ORAL_TABLET | Freq: Every day | ORAL | Status: DC

## 2012-08-11 ENCOUNTER — Encounter: Payer: Self-pay | Admitting: Family Medicine

## 2012-08-18 ENCOUNTER — Ambulatory Visit (INDEPENDENT_AMBULATORY_CARE_PROVIDER_SITE_OTHER): Payer: Medicare Other | Admitting: Cardiology

## 2012-08-18 ENCOUNTER — Encounter: Payer: Self-pay | Admitting: Cardiology

## 2012-08-18 VITALS — BP 122/66 | HR 48 | Ht 65.0 in | Wt 159.1 lb

## 2012-08-18 DIAGNOSIS — I119 Hypertensive heart disease without heart failure: Secondary | ICD-10-CM

## 2012-08-18 DIAGNOSIS — I480 Paroxysmal atrial fibrillation: Secondary | ICD-10-CM

## 2012-08-18 DIAGNOSIS — E7800 Pure hypercholesterolemia, unspecified: Secondary | ICD-10-CM

## 2012-08-18 DIAGNOSIS — I779 Disorder of arteries and arterioles, unspecified: Secondary | ICD-10-CM

## 2012-08-18 DIAGNOSIS — I4891 Unspecified atrial fibrillation: Secondary | ICD-10-CM

## 2012-08-18 LAB — CBC WITH DIFFERENTIAL/PLATELET
Eosinophils Relative: 2.7 % (ref 0.0–5.0)
HCT: 35.8 % — ABNORMAL LOW (ref 36.0–46.0)
Hemoglobin: 11.8 g/dL — ABNORMAL LOW (ref 12.0–15.0)
Lymphs Abs: 1.5 10*3/uL (ref 0.7–4.0)
Monocytes Relative: 9 % (ref 3.0–12.0)
Platelets: 271 10*3/uL (ref 150.0–400.0)
WBC: 6.1 10*3/uL (ref 4.5–10.5)

## 2012-08-18 LAB — LIPID PANEL
Cholesterol: 145 mg/dL (ref 0–200)
HDL: 53.9 mg/dL (ref >39.00)
Triglycerides: 61 mg/dL (ref 0.0–149.0)
VLDL: 12.2 mg/dL (ref 0.0–40.0)

## 2012-08-18 LAB — BASIC METABOLIC PANEL
BUN: 13 mg/dL (ref 6–23)
GFR: 72.4 mL/min (ref >60.00)
Potassium: 3.9 mEq/L (ref 3.5–5.1)
Sodium: 139 mEq/L (ref 135–145)

## 2012-08-18 LAB — HEPATIC FUNCTION PANEL
ALT: 16 U/L (ref 0–35)
Albumin: 3.6 g/dL (ref 3.5–5.2)
Total Protein: 6.9 g/dL (ref 6.0–8.3)

## 2012-08-18 NOTE — Assessment & Plan Note (Signed)
Blood pressure is remaining stable on current therapy. 

## 2012-08-18 NOTE — Assessment & Plan Note (Addendum)
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  Her initial study was done in [place] in September 2013.  She has not been having any recurrent TIA symptoms since starting the Xarelto.
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
## 2012-08-18 NOTE — Patient Instructions (Signed)
Will obtain labs today and call you with the results (lp/bmet/hfp)  STOP YOUR ASPIRIN  Your physician recommends that you schedule a follow-up appointment in: 4 months with fasting labs (lp/bmet/hfp/cbc)and EKG

## 2012-08-18 NOTE — Assessment & Plan Note (Signed)
The patient has not been aware of any recurrent arrhythmia except for 1 week ago she felt like she was out of rhythm for about 2 hours.. she has not been experiencing any problems from the Xarelto except for easy bruisibility.  She has also been taking an aspirin and we will stop her aspirin at this point and continue with Xarelto alone.

## 2012-08-18 NOTE — Progress Notes (Signed)
Quick Note:  Please report to patient. The recent labs are stable. Continue same medication and careful diet. Hgb is down slightly. Will recheck at next OV. ______

## 2012-08-19 ENCOUNTER — Telehealth: Payer: Self-pay | Admitting: Cardiology

## 2012-08-19 DIAGNOSIS — D649 Anemia, unspecified: Secondary | ICD-10-CM

## 2012-08-19 DIAGNOSIS — I119 Hypertensive heart disease without heart failure: Secondary | ICD-10-CM

## 2012-08-19 NOTE — Telephone Encounter (Signed)
Message copied by Burnell Blanks on Tue Aug 19, 2012  3:21 PM ------      Message from: Cassell Clement      Created: Mon Aug 18, 2012 10:00 PM       Please report to patient.  The recent labs are stable. Continue same medication and careful diet. Hgb is down slightly. Will recheck at next OV. ------

## 2012-08-19 NOTE — Telephone Encounter (Signed)
Advised patient of lab results and scheduled ov with labs

## 2012-08-19 NOTE — Telephone Encounter (Signed)
New Problem:    Patient called in returning your call. Please call back. 

## 2012-08-20 ENCOUNTER — Other Ambulatory Visit: Payer: Self-pay | Admitting: Nurse Practitioner

## 2012-09-08 ENCOUNTER — Telehealth: Payer: Self-pay | Admitting: Family Medicine

## 2012-09-08 NOTE — Telephone Encounter (Signed)
Please advise 

## 2012-09-08 NOTE — Telephone Encounter (Signed)
So this patient has not been seen in close to a year. I thought she had actually left the practice, she can follow the labs at [facility] or [facility] but she needs to come in and see one of [place] if she wants to stay under our care at either location. Can order a cbc once she has an appt

## 2012-09-09 NOTE — Telephone Encounter (Signed)
Patient is aware and feels like she will wait to see how she feels and if things change she will make an appt.

## 2012-09-23 ENCOUNTER — Ambulatory Visit (INDEPENDENT_AMBULATORY_CARE_PROVIDER_SITE_OTHER): Payer: Medicare Other | Admitting: Family

## 2012-09-23 ENCOUNTER — Encounter: Payer: Self-pay | Admitting: Family

## 2012-09-23 VITALS — BP 146/72 | HR 48 | Temp 97.7°F | Resp 16 | Wt 159.0 lb

## 2012-09-23 DIAGNOSIS — R310 Gross hematuria: Secondary | ICD-10-CM

## 2012-09-23 DIAGNOSIS — N390 Urinary tract infection, site not specified: Secondary | ICD-10-CM

## 2012-09-23 DIAGNOSIS — D649 Anemia, unspecified: Secondary | ICD-10-CM

## 2012-09-23 LAB — URINALYSIS, ROUTINE W REFLEX MICROSCOPIC
Bilirubin Urine: NEGATIVE
Glucose, UA: NEGATIVE mg/dL
Hgb urine dipstick: NEGATIVE
Ketones, ur: NEGATIVE mg/dL
Protein, ur: NEGATIVE mg/dL

## 2012-09-23 LAB — POCT URINALYSIS DIPSTICK
Ketones, UA: NEGATIVE
Nitrite, UA: NEGATIVE
Protein, UA: NEGATIVE
Urobilinogen, UA: 0.2

## 2012-09-23 LAB — CBC WITH DIFFERENTIAL/PLATELET
Basophils Absolute: 0.1 10*3/uL (ref 0.0–0.1)
Basophils Relative: 1 % (ref 0–1)
Eosinophils Relative: 2 % (ref 0–5)
HCT: 35.7 % — ABNORMAL LOW (ref 36.0–46.0)
Hemoglobin: 11.9 g/dL — ABNORMAL LOW (ref 12.0–15.0)
MCHC: 33.3 g/dL (ref 30.0–36.0)
MCV: 89.3 fL (ref 78.0–100.0)
Monocytes Absolute: 0.8 10*3/uL (ref 0.1–1.0)
Monocytes Relative: 10 % (ref 3–12)
RDW: 14.7 % (ref 11.5–15.5)

## 2012-09-23 MED ORDER — CIPROFLOXACIN HCL 500 MG PO TABS: 500.0000 mg | ORAL_TABLET | Freq: Two times a day (BID) | ORAL | Status: DC

## 2012-09-23 NOTE — Assessment & Plan Note (Addendum)
Urine dip + leuks/small blood. Will rx with cipro and send for culture.  I have given pt lab slip to bring back to the lab in 1 month to repeat urinalysis and ensure that microscopic hematuria has resolved. If not, plan CT and urology referral.

## 2012-09-23 NOTE — Patient Instructions (Addendum)
Call if recurrent blood in urine. Return to lab in 1 month to repeat your urinalysis.

## 2012-09-23 NOTE — Progress Notes (Signed)
Subjective:    Patient ID: Autumn Moss, female    DOB: 07-24-1940, 72 y.o.   MRN: [id number]  HPI  Autumn Moss is a 72 yr old female who presents today with chief complaint of hematuria. Reports last Sunday she took to abx tabs she had on hand from "my foot."  She had hematuria on Sunday and then again Thursday of last week. She denies hx of kidney stones.  She reports previous hx of hematuria with UTI. Denies dysuria currently but did have pain initially.    Anemia-  In April cardiology checked her blood count and she was noted to be mildly anemic (hgb 11.8 which was normocytic). She reports that she is due for follow up colonoscopy next year.  She is on xarelto and aspirin. She stopped the ASA per cardiology.      Review of Systems See HPI  Past Medical History  Diagnosis Date  . Hypertension   . Paroxysmal atrial fibrillation   . Palpitations     sporadic  . Hyperlipidemia   . Aortic valve disease     mild - 07/05/05 per echocardiogram   . Left ventricular diastolic dysfunction     mild - 07/05/05 per echocardiogram  . COPD (chronic obstructive pulmonary disease)   . Ectopic pregnancy     history of x2  . Dyspepsia     occasional  . History of echocardiogram 01/26/2009    Est. EF of 55-60%  --  MILD MITRAL REGURGITATION  -  SINCE LAST ECHO OF 07/10/05 NO SIGNIFICANT CHANGES.  ---  Clovis Pu Brackbill, MD  . Sinusitis acute 04/10/2011  . 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 Chronic anticoagulation     short course of Xarelto  . TIA (transient ischemic attack) 01/2012    back on Xarelto  . Bradycardia, sinus     History   Social History  . Marital Status: Married    Spouse Name: Merlyn Albert    Number of Children: 1  . Years of Education: N/A   Occupational History  .     Social History Main Topics  . Smoking status: Former Smoker -- 0.50 packs/day for 25 years    Types: Cigarettes    Quit date: 06/22/2005  . Smokeless tobacco: Never Used  . Alcohol Use: No  . Drug Use:  No  . Sexually Active: Not Currently   Other Topics Concern  . Not on file   Social History Narrative   Uses seat belt   Worked at [employer], Retired   Regular exercise: yes, [facility] [employer] [facility] and walks   Diet, no restrictions, minimizes dairy    Past Surgical History  Procedure Laterality Date  . Ectopic pregnancy surgery      bilateral, both tubes excised and 1 ovary excised  . Oophorectomy    . Appendectomy      Family History  Problem Relation Age of Onset  . Kidney failure Mother   . Hypertension Mother   . Stroke Mother   . Arthritis Mother     RA  . Cancer Father     bladder cancer  . Atrial fibrillation Brother   . Sleep apnea Brother   . Hypertension Brother   . Breast cancer Sister     No Known Allergies  Current Outpatient Prescriptions on File Prior to Visit  Medication Sig Dispense Refill  . CALCIUM PO Take 500 mg by mouth daily.       . digoxin (LANOXIN) 0.125 MG tablet Take  1 tablet (0.125 mg total) by mouth daily.  90 tablet  3  . Lutein 20 MG CAPS Take 1 capsule by mouth daily.      . metoprolol succinate (TOPROL-XL) 25 MG 24 hr tablet 1/2 tablet twice a day      . multivitamin (THERAGRAN) per tablet Take 1 tablet by mouth daily.        Marland Kitchen omeprazole (PRILOSEC) 20 MG capsule Take 1 capsule (20 mg total) by mouth 2 (two) times daily.  180 capsule  3  . simvastatin (ZOCOR) 20 MG tablet Take 1 tablet (20 mg total) by mouth daily.  90 tablet  0  . XARELTO 20 MG TABS TAKE 1 TABLET BY MOUTH DAILY WITH SUPPER  30 tablet  6   No current facility-administered medications on file prior to visit.    BP 146/72  Pulse 48  Temp(Src) 97.7 F (36.5 C) (Oral)  Resp 16  Wt 159 lb (72.122 kg)  BMI 26.46 kg/m2  SpO2 98%       Objective:   Physical Exam  Constitutional: She is oriented to person, place, and time. She appears well-developed and well-nourished. No distress.  HENT:  Head: Normocephalic and atraumatic.  Cardiovascular: Normal rate and regular  rhythm.   No murmur heard. Pulmonary/Chest: Effort normal and breath sounds normal. No respiratory distress. She has no wheezes. She has no rales. She exhibits no tenderness.  Abdominal: Soft. Bowel sounds are normal. She exhibits no distension. There is no tenderness. There is no rebound.  Genitourinary:  Neg CVAT bilaterally.  Musculoskeletal: She exhibits no edema.  Lymphadenopathy:    She has no cervical adenopathy.  Neurological: She is alert and oriented to person, place, and time.  Psychiatric: She has a normal mood and affect. Her behavior is normal. Judgment and thought content normal.          Assessment & Plan:

## 2012-09-23 NOTE — Assessment & Plan Note (Signed)
Repeat CBC today 

## 2012-09-24 LAB — URINALYSIS, MICROSCOPIC ONLY: Casts: NONE SEEN

## 2012-09-25 LAB — URINE CULTURE

## 2012-10-02 ENCOUNTER — Telehealth: Payer: Self-pay | Admitting: Family Medicine

## 2012-10-02 NOTE — Telephone Encounter (Signed)
Patient is requesting results from last visit °

## 2012-10-02 NOTE — Telephone Encounter (Signed)
Called patient to let her know that her lab results have not been reviewed yet and that we will call her when labs come back.

## 2012-10-19 ENCOUNTER — Other Ambulatory Visit: Payer: Self-pay | Admitting: Cardiology

## 2012-10-24 ENCOUNTER — Other Ambulatory Visit: Payer: Self-pay | Admitting: Family

## 2012-10-25 LAB — URINALYSIS, ROUTINE W REFLEX MICROSCOPIC
Bilirubin Urine: NEGATIVE
Glucose, UA: NEGATIVE mg/dL
Hgb urine dipstick: NEGATIVE
Ketones, ur: NEGATIVE mg/dL
Leukocytes, UA: NEGATIVE
Nitrite: NEGATIVE
Protein, ur: NEGATIVE mg/dL
Specific Gravity, Urine: <1.005 — ABNORMAL LOW (ref 1.005–1.030)
Urobilinogen, UA: 0.2 mg/dL (ref 0.0–1.0)
pH: 7 (ref 5.0–8.0)

## 2012-11-10 ENCOUNTER — Other Ambulatory Visit (INDEPENDENT_AMBULATORY_CARE_PROVIDER_SITE_OTHER): Payer: Medicare Other

## 2012-11-10 ENCOUNTER — Other Ambulatory Visit: Payer: Self-pay | Admitting: *Deleted

## 2012-11-10 DIAGNOSIS — Z1211 Encounter for screening for malignant neoplasm of colon: Secondary | ICD-10-CM

## 2012-11-11 ENCOUNTER — Encounter: Payer: Self-pay | Admitting: *Deleted

## 2012-12-04 ENCOUNTER — Ambulatory Visit: Payer: Self-pay | Admitting: Obstetrics & Gynecology

## 2012-12-15 ENCOUNTER — Encounter: Payer: Medicare Other | Admitting: Family Medicine

## 2012-12-17 ENCOUNTER — Ambulatory Visit (INDEPENDENT_AMBULATORY_CARE_PROVIDER_SITE_OTHER): Payer: Medicare Other | Admitting: Cardiology

## 2012-12-17 ENCOUNTER — Encounter (INDEPENDENT_AMBULATORY_CARE_PROVIDER_SITE_OTHER): Payer: Medicare Other

## 2012-12-17 ENCOUNTER — Other Ambulatory Visit: Payer: Medicare Other

## 2012-12-17 ENCOUNTER — Encounter: Payer: Self-pay | Admitting: Cardiology

## 2012-12-17 VITALS — BP 142/70 | HR 50 | Ht 65.0 in | Wt 161.8 lb

## 2012-12-17 DIAGNOSIS — I480 Paroxysmal atrial fibrillation: Secondary | ICD-10-CM

## 2012-12-17 DIAGNOSIS — I779 Disorder of arteries and arterioles, unspecified: Secondary | ICD-10-CM

## 2012-12-17 DIAGNOSIS — D649 Anemia, unspecified: Secondary | ICD-10-CM

## 2012-12-17 DIAGNOSIS — E7800 Pure hypercholesterolemia, unspecified: Secondary | ICD-10-CM

## 2012-12-17 DIAGNOSIS — I6529 Occlusion and stenosis of unspecified carotid artery: Secondary | ICD-10-CM

## 2012-12-17 DIAGNOSIS — I6522 Occlusion and stenosis of left carotid artery: Secondary | ICD-10-CM

## 2012-12-17 DIAGNOSIS — I119 Hypertensive heart disease without heart failure: Secondary | ICD-10-CM

## 2012-12-17 DIAGNOSIS — I4891 Unspecified atrial fibrillation: Secondary | ICD-10-CM

## 2012-12-17 DIAGNOSIS — E782 Mixed hyperlipidemia: Secondary | ICD-10-CM

## 2012-12-17 LAB — CBC WITH DIFFERENTIAL/PLATELET
Eosinophils Relative: 2.6 % (ref 0.0–5.0)
HCT: 37.4 % (ref 36.0–46.0)
Hemoglobin: 12.4 g/dL (ref 12.0–15.0)
Lymphs Abs: 1.6 10*3/uL (ref 0.7–4.0)
MCV: 92.4 fl (ref 78.0–100.0)
Monocytes Relative: 9.9 % (ref 3.0–12.0)
Neutro Abs: 4.9 10*3/uL (ref 1.4–7.7)
RDW: 14.1 % (ref 11.5–14.6)
WBC: 7.4 10*3/uL (ref 4.5–10.5)

## 2012-12-17 LAB — LIPID PANEL
HDL: 51.5 mg/dL (ref >39.00)
LDL Cholesterol: 90 mg/dL (ref 0–99)
VLDL: 13.2 mg/dL (ref 0.0–40.0)

## 2012-12-17 LAB — BASIC METABOLIC PANEL
Chloride: 107 mEq/L (ref 96–112)
GFR: 70.35 mL/min (ref >60.00)
Glucose, Bld: 85 mg/dL (ref 70–99)
Potassium: 3.9 mEq/L (ref 3.5–5.1)
Sodium: 140 mEq/L (ref 135–145)

## 2012-12-17 LAB — HEPATIC FUNCTION PANEL
ALT: 22 U/L (ref 0–35)
Total Bilirubin: 0.7 mg/dL (ref 0.3–1.2)
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
## 2012-12-17 NOTE — Patient Instructions (Signed)
Will obtain labs today and call you with the results (lp/bmet/hfp/cbc)  Your physician recommends that you continue on your current medications as directed. Please refer to the Current Medication list given to you today.  Your physician recommends that you schedule a follow-up appointment in: 4 months with fasting labs (lp/bmet/hfp)  Your physician has requested that you have a carotid duplex. This test is an ultrasound of the carotid arteries in your neck. It looks at blood flow through these arteries that supply the brain with blood. Allow one hour for this exam. There are no restrictions or special instructions.

## 2012-12-17 NOTE — Assessment & Plan Note (Signed)
Patient has a history of dyslipidemia.  She is trying to watch her diet carefully.  We will check labs today.

## 2012-12-17 NOTE — Assessment & Plan Note (Signed)
In the last 4 months the patient has had only 2 episodes of atrial fibrillation each of which lasted less than 8 hours

## 2012-12-17 NOTE — Progress Notes (Signed)
Quick Note:  Please report to patient. The recent labs are stable. Continue same medication and careful diet. Hgb is better 12.4 ______

## 2012-12-17 NOTE — Assessment & Plan Note (Signed)
The patient has had no further TIA symptoms.  We are going to get a carotid Doppler to followup on the previously abnormal carotid done at [facility] which showed a 50-69% narrowing in the left carotid one year ago

## 2012-12-18 ENCOUNTER — Encounter: Payer: Self-pay | Admitting: Family Medicine

## 2012-12-18 ENCOUNTER — Ambulatory Visit (INDEPENDENT_AMBULATORY_CARE_PROVIDER_SITE_OTHER): Payer: Medicare Other | Admitting: Family Medicine

## 2012-12-18 ENCOUNTER — Other Ambulatory Visit (HOSPITAL_COMMUNITY)
Admission: RE | Admit: 2012-12-18 | Discharge: 2012-12-18 | Disposition: A | Discharge status: Home / Self Care | Payer: Medicare Other | Source: Ambulatory Visit | Attending: Family Medicine | Admitting: Family Medicine

## 2012-12-18 VITALS — BP 144/82 | HR 46 | Temp 98.4°F | Ht 65.0 in | Wt 161.1 lb

## 2012-12-18 DIAGNOSIS — N390 Urinary tract infection, site not specified: Secondary | ICD-10-CM

## 2012-12-18 DIAGNOSIS — E782 Mixed hyperlipidemia: Secondary | ICD-10-CM

## 2012-12-18 DIAGNOSIS — D649 Anemia, unspecified: Secondary | ICD-10-CM

## 2012-12-18 DIAGNOSIS — I4891 Unspecified atrial fibrillation: Secondary | ICD-10-CM

## 2012-12-18 DIAGNOSIS — Z124 Encounter for screening for malignant neoplasm of cervix: Secondary | ICD-10-CM | POA: Insufficient documentation

## 2012-12-18 DIAGNOSIS — I119 Hypertensive heart disease without heart failure: Secondary | ICD-10-CM

## 2012-12-18 DIAGNOSIS — E663 Overweight: Secondary | ICD-10-CM

## 2012-12-18 MED ORDER — CIPROFLOXACIN HCL 500 MG PO TABS: 500.0000 mg | ORAL_TABLET | Freq: Two times a day (BID) | ORAL | Status: DC

## 2012-12-18 NOTE — Patient Instructions (Addendum)
Digestive Advantage probiotic daily Cranberry tabs can help if early   Dermatology for changing mole and sun damage. Zigmund Daniel, [place]  Urinary Tract Infection Urinary tract infections (UTIs) can develop anywhere along your urinary tract. Your urinary tract is your body's drainage system for removing wastes and extra water. Your urinary tract includes two kidneys, two ureters, a bladder, and a urethra. Your kidneys are a pair of bean-shaped organs. Each kidney is about the size of your fist. They are located below your ribs, one on each side of your spine. CAUSES Infections are caused by microbes, which are microscopic organisms, including fungi, viruses, and bacteria. These organisms are so small that they can only be seen through a microscope. Bacteria are the microbes that most commonly cause UTIs. SYMPTOMS  Symptoms of UTIs may vary by age and gender of the patient and by the location of the infection. Symptoms in young women typically include a frequent and intense urge to urinate and a painful, burning feeling in the bladder or urethra during urination. Older women and men are more likely to be tired, shaky, and weak and have muscle aches and abdominal pain. A fever may mean the infection is in your kidneys. Other symptoms of a kidney infection include pain in your back or sides below the ribs, nausea, and vomiting. DIAGNOSIS To diagnose a UTI, your caregiver will ask you about your symptoms. Your caregiver also will ask to provide a urine sample. The urine sample will be tested for bacteria and white blood cells. White blood cells are made by your body to help fight infection. TREATMENT  Typically, UTIs can be treated with medication. Because most UTIs are caused by a bacterial infection, they usually can be treated with the use of antibiotics. The choice of antibiotic and length of treatment depend on your symptoms and the type of bacteria causing your infection. HOME CARE  INSTRUCTIONS  If you were prescribed antibiotics, take them exactly as your caregiver instructs you. Finish the medication even if you feel better after you have only taken some of the medication.  Drink enough water and fluids to keep your urine clear or pale yellow.  Avoid caffeine, tea, and carbonated beverages. They tend to irritate your bladder.  Empty your bladder often. Avoid holding urine for long periods of time.  Empty your bladder before and after sexual intercourse.  After a bowel movement, women should cleanse from front to back. Use each tissue only once. SEEK MEDICAL CARE IF:   You have back pain.  You develop a fever.  Your symptoms do not begin to resolve within 3 days. SEEK IMMEDIATE MEDICAL CARE IF:   You have severe back pain or lower abdominal pain.  You develop chills.  You have nausea or vomiting.  You have continued burning or discomfort with urination. MAKE SURE YOU:   Understand these instructions.  Will watch your condition.  Will get help right away if you are not doing well or get worse. Document Released: 01/31/2005 Document Revised: 10/23/2011 Document Reviewed: 06/01/2011 [facility] Patient Information 2014 [place], [place].

## 2012-12-19 ENCOUNTER — Telehealth: Payer: Self-pay | Admitting: *Deleted

## 2012-12-19 NOTE — Telephone Encounter (Signed)
Message copied by Burnell Blanks on Fri Dec 19, 2012  1:10 PM ------      Message from: Cassell Clement      Created: Wed Dec 17, 2012  9:23 PM       Please report to patient.  The recent labs are stable. Continue same medication and careful diet. Hgb is better 12.4 ------

## 2012-12-19 NOTE — Telephone Encounter (Signed)
Message copied by Burnell Blanks on Fri Dec 19, 2012  1:14 PM ------      Message from: Cassell Clement      Created: Fri Dec 19, 2012  7:18 AM       Please report.  The carotids look better than last year's study at [facility]. Less than 40% now.  Continue same meds. ------

## 2012-12-19 NOTE — Telephone Encounter (Signed)
Advised patient

## 2012-12-20 ENCOUNTER — Encounter: Payer: Self-pay | Admitting: Cardiology

## 2012-12-21 NOTE — Assessment & Plan Note (Signed)
Well controlled 

## 2012-12-21 NOTE — Assessment & Plan Note (Signed)
Encouraged DASH diet and increased exercise 

## 2012-12-21 NOTE — Assessment & Plan Note (Signed)
Well controlled on Xarelto and Digoxin

## 2012-12-21 NOTE — Assessment & Plan Note (Signed)
Tolerating Simvastatin with good results

## 2012-12-21 NOTE — Progress Notes (Signed)
Patient ID: Autumn Moss, female   DOB: 14-Jul-1940, 72 y.o.   MRN: [id number] SANAM MARMO [id number] [birth year] 12/21/2012      Progress Note-Follow Up  Subjective  Chief Complaint  Chief Complaint  Patient presents with  . Annual Exam    physical  . Gynecologic Exam    pap    HPI  Patient is a 72 year old female who is in today for routine medical care. She feels well. She denies any recent illness. She's had no hospitalizations. She denies headache or chest pain. She denies congestion or fevers. She denies palpitations, shortness of breath, GI or GU concerns. She is remaining active and eating well.  Past Medical History  Diagnosis Date  . Hypertension   . Paroxysmal atrial fibrillation   . Palpitations     sporadic  . Hyperlipidemia   . Aortic valve disease     mild - 07/05/05 per echocardiogram   . Left ventricular diastolic dysfunction     mild - 07/05/05 per echocardiogram  . COPD (chronic obstructive pulmonary disease)   . Ectopic pregnancy     history of x2  . Dyspepsia     occasional  . History of echocardiogram 01/26/2009    Est. EF of 55-60%  --  MILD MITRAL REGURGITATION  -  SINCE LAST ECHO OF 07/10/05 NO SIGNIFICANT CHANGES.  ---  Clovis Pu Brackbill, MD  . Sinusitis acute 04/10/2011  . 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 Chronic anticoagulation     short course of Xarelto  . TIA (transient ischemic attack) 01/2012    back on Xarelto  . Bradycardia, sinus     Past Surgical History  Procedure Laterality Date  . Ectopic pregnancy surgery      bilateral, both tubes excised and 1 ovary excised  . Oophorectomy    . Appendectomy      Family History  Problem Relation Age of Onset  . Kidney failure Mother   . Hypertension Mother   . Stroke Mother   . Arthritis Mother     RA  . Cancer Father     bladder cancer  . Atrial fibrillation Brother   . Sleep apnea Brother   . Hypertension Brother   . Breast cancer Sister     History   Social History   . Marital Status: Married    Spouse Name: Merlyn Albert    Number of Children: 1  . Years of Education: N/A   Occupational History  .     Social History Main Topics  . Smoking status: Former Smoker -- 0.50 packs/day for 25 years    Types: Cigarettes    Quit date: 06/22/2005  . Smokeless tobacco: Never Used  . Alcohol Use: No  . Drug Use: No  . Sexual Activity: Not Currently   Other Topics Concern  . Not on file   Social History Narrative   Uses seat belt   Worked at [employer], Retired   Regular exercise: yes, [facility] and [facility]   Diet, no restrictions, minimizes dairy    Current Outpatient Prescriptions on File Prior to Visit  Medication Sig Dispense Refill  . CALCIUM PO Take 500 mg by mouth daily.       . digoxin (LANOXIN) 0.125 MG tablet Take 1 tablet (0.125 mg total) by mouth daily.  90 tablet  3  . Lutein 20 MG CAPS Take 1 capsule by mouth daily.      . metoprolol succinate (TOPROL-XL) 25 MG 24 hr tablet  1/2 tablet twice a day      . multivitamin (THERAGRAN) per tablet Take 1 tablet by mouth daily.        Marland Kitchen omeprazole (PRILOSEC) 20 MG capsule Take 1 capsule (20 mg total) by mouth 2 (two) times daily.  180 capsule  3  . simvastatin (ZOCOR) 20 MG tablet TAKE 1 TABLET BY MOUTH DAILY  90 tablet  1  . XARELTO 20 MG TABS TAKE 1 TABLET BY MOUTH DAILY WITH SUPPER  30 tablet  6   No current facility-administered medications on file prior to visit.    No Known Allergies  Review of Systems  Review of Systems  Constitutional: Negative for fever and malaise/fatigue.  HENT: Negative for congestion.   Eyes: Negative for discharge.  Respiratory: Negative for shortness of breath.   Cardiovascular: Negative for chest pain, palpitations and leg swelling.  Gastrointestinal: Negative for nausea, abdominal pain and diarrhea.  Genitourinary: Negative for dysuria.  Musculoskeletal: Negative for falls.  Skin: Negative for rash.  Neurological: Negative for loss of consciousness and  headaches.  Endo/Heme/Allergies: Negative for polydipsia.  Psychiatric/Behavioral: Negative for depression and suicidal ideas. The patient is not nervous/anxious and does not have insomnia.     Objective  BP 144/82  Pulse 46  Temp(Src) 98.4 F (36.9 C) (Oral)  Ht 5\' 5"  (1.651 m)  Wt 161 lb 1.3 oz (73.065 kg)  BMI 26.8 kg/m2  SpO2 96%  Physical Exam  Physical Exam  Constitutional: She is oriented to person, place, and time and well-developed, well-nourished, and in no distress. No distress.  HENT:  Head: Normocephalic and atraumatic.  Eyes: Conjunctivae are normal.  Neck: Neck supple. No thyromegaly present.  Cardiovascular: Regular rhythm and normal heart sounds.   No murmur heard. Pulmonary/Chest: Effort normal and breath sounds normal. She has no wheezes.  Abdominal: Soft. Bowel sounds are normal. She exhibits no distension and no mass.  Musculoskeletal: She exhibits no edema.  Lymphadenopathy:    She has no cervical adenopathy.  Neurological: She is alert and oriented to person, place, and time.  Skin: Skin is warm and dry. No rash noted. She is not diaphoretic.  Psychiatric: Memory, affect and judgment normal.    Lab Results  Component Value Date   TSH 2.71 11/20/2011   Lab Results  Component Value Date   WBC 7.4 12/17/2012   HGB 12.4 12/17/2012   HCT 37.4 12/17/2012   MCV 92.4 12/17/2012   PLT 291.0 12/17/2012   Lab Results  Component Value Date   CREATININE 0.8 12/17/2012   BUN 14 12/17/2012   NA 140 12/17/2012   K 3.9 12/17/2012   CL 107 12/17/2012   CO2 29 12/17/2012   Lab Results  Component Value Date   ALT 22 12/17/2012   AST 24 12/17/2012   ALKPHOS 85 12/17/2012   BILITOT 0.7 12/17/2012   Lab Results  Component Value Date   CHOL 155 12/17/2012   Lab Results  Component Value Date   HDL 51.50 12/17/2012   Lab Results  Component Value Date   LDLCALC 90 12/17/2012   Lab Results  Component Value Date   TRIG 66.0 12/17/2012   Lab Results  Component  Value Date   CHOLHDL 3 12/17/2012     Assessment & Plan  Atrial fibrillation Well controlled on Xarelto and Digoxin  Normocytic anemia resolved  Mixed hyperlipidemia Tolerating Simvastatin with good results  OVERWEIGHT Encouraged DASH diet and increased exercise.  Benign hypertensive heart disease without heart failure Well  controlled

## 2012-12-21 NOTE — Assessment & Plan Note (Signed)
resolved 

## 2013-01-12 NOTE — Progress Notes (Unsigned)
See documentation.

## 2013-01-12 NOTE — Telephone Encounter (Signed)
New Problem  Pt states she has been in Afib since Friday//

## 2013-01-13 ENCOUNTER — Encounter: Payer: Self-pay | Admitting: Cardiology

## 2013-01-13 ENCOUNTER — Ambulatory Visit (INDEPENDENT_AMBULATORY_CARE_PROVIDER_SITE_OTHER): Payer: Medicare Other | Admitting: Cardiology

## 2013-01-13 ENCOUNTER — Telehealth: Payer: Self-pay | Admitting: Cardiology

## 2013-01-13 VITALS — BP 128/78 | HR 70 | Ht 65.0 in | Wt 158.0 lb

## 2013-01-13 DIAGNOSIS — I480 Paroxysmal atrial fibrillation: Secondary | ICD-10-CM

## 2013-01-13 DIAGNOSIS — I4891 Unspecified atrial fibrillation: Secondary | ICD-10-CM

## 2013-01-13 MED ORDER — FLECAINIDE ACETATE 50 MG PO TABS: 50.0000 mg | ORAL_TABLET | Freq: Two times a day (BID) | ORAL | Status: DC

## 2013-01-13 NOTE — Telephone Encounter (Signed)
New problem   Would like to be seen today    C/O Afib.since Friday last week . No er visit. Call yesterday to speak with [facility] .

## 2013-01-13 NOTE — Telephone Encounter (Signed)
Scheduled office visit for this morning, patient aware
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
## 2013-01-13 NOTE — Assessment & Plan Note (Signed)
The patient has been in atrial fibrillation for the past 4 days.  She's not having any chest pain associated with it.  No TIAs.  She has taken extra metoprolol with no improvement in her heart rhythm.  She denies any precipitating factors to have caused the recurrent atrial fib.  She does not drink alcohol.  She avoids caffeine.

## 2013-01-13 NOTE — Patient Instructions (Addendum)
STOP DIGOXIN  START FLECAINIDE 50 MG TWICE A DAY, SENT TO [facility]  FOLLOW UP NEXT WEEK 01/21/13 FOR OV/EKG

## 2013-01-15 ENCOUNTER — Telehealth: Payer: Self-pay | Admitting: Cardiology

## 2013-01-15 NOTE — Telephone Encounter (Signed)
Pt is now in SR and feeling well, she was told to call with further questions, she agreed to plan and has f/u app already.

## 2013-01-15 NOTE — Telephone Encounter (Signed)
I am glad she is feeling better.

## 2013-01-15 NOTE — Telephone Encounter (Signed)
Pt called and stated she is still in afib and would like to discuss this with someone.  Please call her cell number to discuss.

## 2013-01-21 ENCOUNTER — Encounter: Payer: Self-pay | Admitting: Cardiology

## 2013-01-21 ENCOUNTER — Ambulatory Visit (INDEPENDENT_AMBULATORY_CARE_PROVIDER_SITE_OTHER): Payer: Medicare Other | Admitting: Cardiology

## 2013-01-21 VITALS — BP 130/66 | HR 50 | Ht 65.0 in | Wt 162.0 lb

## 2013-01-21 DIAGNOSIS — I4891 Unspecified atrial fibrillation: Secondary | ICD-10-CM

## 2013-01-21 DIAGNOSIS — I119 Hypertensive heart disease without heart failure: Secondary | ICD-10-CM

## 2013-01-21 DIAGNOSIS — I779 Disorder of arteries and arterioles, unspecified: Secondary | ICD-10-CM
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
## 2013-01-21 NOTE — Assessment & Plan Note (Signed)
She is remaining in normal sinus rhythm on her current regimen of flecainide and Toprol.  She is not having any chest discomfort or shortness of breath.  No palpitations

## 2013-01-21 NOTE — Assessment & Plan Note (Signed)
The patient has not had any TIA symptoms. 

## 2013-01-21 NOTE — Patient Instructions (Addendum)
RESUME YOUR USUAL ACTIVITY  Your physician recommends that you continue on your current medications as directed. Please refer to the Current Medication list given to you today.  KEEP YOUR APPOINTMENT IN [place]

## 2013-01-21 NOTE — Assessment & Plan Note (Signed)
Blood pressure was remaining stable on current medication.  No dizziness or syncope. 

## 2013-02-02 ENCOUNTER — Other Ambulatory Visit: Payer: Self-pay | Admitting: Nurse Practitioner

## 2013-03-12 ENCOUNTER — Other Ambulatory Visit: Payer: Self-pay

## 2013-03-30 ENCOUNTER — Other Ambulatory Visit: Payer: Self-pay

## 2013-03-30 MED ORDER — RIVAROXABAN 20 MG PO TABS: ORAL_TABLET | ORAL | Status: DC

## 2013-04-08 ENCOUNTER — Other Ambulatory Visit: Payer: Self-pay | Admitting: Cardiology

## 2013-04-21 ENCOUNTER — Encounter: Payer: Self-pay | Admitting: Cardiology

## 2013-04-21 ENCOUNTER — Ambulatory Visit (INDEPENDENT_AMBULATORY_CARE_PROVIDER_SITE_OTHER): Payer: Medicare Other | Admitting: Cardiology

## 2013-04-21 VITALS — BP 146/64 | HR 53 | Ht 65.0 in | Wt 166.0 lb

## 2013-04-21 DIAGNOSIS — I4891 Unspecified atrial fibrillation: Secondary | ICD-10-CM

## 2013-04-21 DIAGNOSIS — I480 Paroxysmal atrial fibrillation: Secondary | ICD-10-CM

## 2013-04-21 DIAGNOSIS — I358 Other nonrheumatic aortic valve disorders: Secondary | ICD-10-CM

## 2013-04-21 DIAGNOSIS — I359 Nonrheumatic aortic valve disorder, unspecified: Secondary | ICD-10-CM

## 2013-04-21 DIAGNOSIS — I779 Disorder of arteries and arterioles, unspecified: Secondary | ICD-10-CM

## 2013-04-21 DIAGNOSIS — E7800 Pure hypercholesterolemia, unspecified: Secondary | ICD-10-CM

## 2013-04-21 DIAGNOSIS — I119 Hypertensive heart disease without heart failure: Secondary | ICD-10-CM

## 2013-04-21 LAB — BASIC METABOLIC PANEL
CO2: 27 mEq/L (ref 19–32)
Chloride: 102 mEq/L (ref 96–112)
Creatinine, Ser: 0.9 mg/dL (ref 0.4–1.2)

## 2013-04-21 LAB — LIPID PANEL
LDL Cholesterol: 85 mg/dL (ref 0–99)
Total CHOL/HDL Ratio: 2

## 2013-04-21 LAB — HEPATIC FUNCTION PANEL
Albumin: 4 g/dL (ref 3.5–5.2)
Alkaline Phosphatase: 84 U/L (ref 39–117)
Bilirubin, Direct: 0.1 mg/dL (ref 0.0–0.3)
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
## 2013-04-21 NOTE — Progress Notes (Signed)
Quick Note:  Please report to patient. The recent labs are stable. Continue same medication and careful diet. ______ 

## 2013-04-21 NOTE — Assessment & Plan Note (Signed)
Patient denies any chest pain or shortness of breath.  No headaches dizziness or syncope.

## 2013-04-21 NOTE — Patient Instructions (Signed)
Will obtain labs today and call you with the results (lp/bmet/hfp)  Your physician recommends that you continue on your current medications as directed. Please refer to the Current Medication list given to you today.  Your physician wants you to follow-up in: 4 months with fasting labs (lp/bmet/hfp) and ekg You will receive a reminder letter in the mail two months in advance. If you don't receive a letter, please call our office to schedule the follow-up appointment.  

## 2013-04-21 NOTE — Assessment & Plan Note (Signed)
The patient has had no further TIA symptoms. 

## 2013-04-21 NOTE — Assessment & Plan Note (Signed)
The patient has had no further atrial fibrillation since going on flecainide.  She is not having any side effects.  She has been getting some exercise.  She is a member of the [employer] program both at home and also at the beach

## 2013-04-28 ENCOUNTER — Telehealth: Payer: Self-pay | Admitting: *Deleted

## 2013-04-28 NOTE — Telephone Encounter (Signed)
Message copied by Burnell Blanks on Tue Apr 28, 2013  9:32 AM ------      Message from: Cassell Clement      Created: Tue Apr 21, 2013  6:40 PM       Please report to patient.  The recent labs are stable. Continue same medication and careful diet. ------

## 2013-04-28 NOTE — Telephone Encounter (Signed)
Mailed copy of labs and left message to call if any questions  

## 2013-05-05 ENCOUNTER — Telehealth: Payer: Self-pay

## 2013-05-05 NOTE — Telephone Encounter (Signed)
Patient left a message stating that she is at the beach and thinks she has a UTI?   I called patient and she states no bleeding just some pain with urination. Frequent urination   Pt states MD wrote her an RX to keep on hand but she left it at home.  Please advise?

## 2013-05-11 NOTE — Telephone Encounter (Signed)
Did you do this?

## 2013-05-11 NOTE — Telephone Encounter (Signed)
No it appears not. Can have Ciprofloxacin 250 mg po bid x 5 days if she still needs it. Disp #10

## 2013-05-12 MED ORDER — CIPROFLOXACIN HCL 250 MG PO TABS: 250.0000 mg | ORAL_TABLET | Freq: Two times a day (BID) | ORAL | Status: DC

## 2013-06-25 ENCOUNTER — Encounter: Payer: Self-pay | Admitting: Family Medicine

## 2013-06-25 ENCOUNTER — Ambulatory Visit (INDEPENDENT_AMBULATORY_CARE_PROVIDER_SITE_OTHER): Payer: Medicare Other | Admitting: Family Medicine

## 2013-06-25 VITALS — BP 110/70 | HR 46 | Temp 98.0°F | Ht 65.0 in | Wt 173.1 lb

## 2013-06-25 DIAGNOSIS — I4891 Unspecified atrial fibrillation: Secondary | ICD-10-CM

## 2013-06-25 DIAGNOSIS — I498 Other specified cardiac arrhythmias: Secondary | ICD-10-CM

## 2013-06-25 DIAGNOSIS — H3530 Unspecified macular degeneration: Secondary | ICD-10-CM

## 2013-06-25 DIAGNOSIS — I119 Hypertensive heart disease without heart failure: Secondary | ICD-10-CM

## 2013-06-25 DIAGNOSIS — R001 Bradycardia, unspecified: Secondary | ICD-10-CM

## 2013-06-25 DIAGNOSIS — E785 Hyperlipidemia, unspecified: Secondary | ICD-10-CM

## 2013-06-25 DIAGNOSIS — H269 Unspecified cataract: Secondary | ICD-10-CM

## 2013-06-25 DIAGNOSIS — D649 Anemia, unspecified: Secondary | ICD-10-CM

## 2013-06-25 DIAGNOSIS — E782 Mixed hyperlipidemia: Secondary | ICD-10-CM

## 2013-06-25 LAB — CBC
HCT: 37.4 % (ref 36.0–46.0)
HEMOGLOBIN: 12.5 g/dL (ref 12.0–15.0)
MCH: 30 pg (ref 26.0–34.0)
MCHC: 33.4 g/dL (ref 30.0–36.0)
MCV: 89.9 fL (ref 78.0–100.0)
Platelets: 270 10*3/uL (ref 150–400)
RBC: 4.16 MIL/uL (ref 3.87–5.11)
RDW: 14.7 % (ref 11.5–15.5)
WBC: 6 10*3/uL (ref 4.0–10.5)

## 2013-06-25 LAB — TSH: TSH: 3.012 u[IU]/mL (ref 0.350–4.500)

## 2013-06-25 NOTE — Patient Instructions (Signed)

## 2013-06-25 NOTE — Progress Notes (Signed)
Pre visit review using our clinic review tool, if applicable. No additional management support is needed unless otherwise documented below in the visit note. 

## 2013-06-28 ENCOUNTER — Encounter: Payer: Self-pay | Admitting: Family Medicine

## 2013-06-28 DIAGNOSIS — H269 Unspecified cataract: Secondary | ICD-10-CM

## 2013-06-28 DIAGNOSIS — H3530 Unspecified macular degeneration: Secondary | ICD-10-CM | POA: Insufficient documentation

## 2013-06-28 HISTORY — DX: Unspecified macular degeneration: H35.30

## 2013-06-28 HISTORY — DX: Unspecified cataract: H26.9

## 2013-06-28 NOTE — Progress Notes (Signed)
Patient ID: Autumn Moss, female   DOB: Feb 09, 1941, 73 y.o.   MRN: [id number] Autumn Moss [id number] Sep 21, 1940 06/28/2013      Progress Note-Follow Up  Subjective    Chief Complaint  Patient presents with  . Follow-up    6 month    HPI  Chief Complaint patient is a 73 year old Caucasian female who is in today for routine followup. Was well. She's had no recent illness. She denies chest pain, palpitations or shortness of breath. Taking medications as prescribed. Is following closely with ophthalmology and has been diagnosed with early macular degeneration as well as bilateral cataracts. Does acknowledge some decrease in visual acuity as a result but no other significant concerns noted today.  Past Medical History  Diagnosis Date  . Hypertension   . Paroxysmal atrial fibrillation   . Palpitations     sporadic  . Hyperlipidemia   . Aortic valve disease     mild - 07/05/05 per echocardiogram   . Left ventricular diastolic dysfunction     mild - 07/05/05 per echocardiogram  . COPD (chronic obstructive pulmonary disease)   . Ectopic pregnancy     history of x2  . Dyspepsia     occasional  . History of echocardiogram 01/26/2009    Est. EF of 55-60%  --  MILD MITRAL REGURGITATION  -  SINCE LAST ECHO OF 07/10/05 NO SIGNIFICANT CHANGES.  ---  Joyice Faster Brackbill, MD  . Sinusitis acute 04/10/2011  . 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 Chronic anticoagulation     short course of Xarelto  . TIA (transient ischemic attack) 01/2012    back on Xarelto  . Bradycardia, sinus   . Cataract 06/28/2013    b/l  . Macular degeneration 06/28/2013    Past Surgical History  Procedure Laterality Date  . Ectopic pregnancy surgery      bilateral, both tubes excised and 1 ovary excised  . Oophorectomy    . Appendectomy      Family History  Problem Relation Age of Onset  . Kidney failure Mother   . Hypertension Mother   . Stroke Mother   . Arthritis Mother     RA  . Cancer Father    bladder cancer  . Atrial fibrillation Brother   . Sleep apnea Brother   . Hypertension Brother   . Breast cancer Sister     History   Social History  . Marital Status: Married    Spouse Name: Josph Macho    Number of Children: 1  . Years of Education: N/A   Occupational History  .     Social History Main Topics  . Smoking status: Former Smoker -- 0.50 packs/day for 25 years    Types: Cigarettes    Quit date: 06/22/2005  . Smokeless tobacco: Never Used  . Alcohol Use: No  . Drug Use: No  . Sexual Activity: Not Currently   Other Topics Concern  . Not on file   Social History Narrative   Uses seat belt   Worked at [employer], Retired   Regular exercise: yes, [facility] and [facility]   Diet, no restrictions, minimizes dairy    Current Outpatient Prescriptions on File Prior to Visit  Medication Sig Dispense Refill  . CALCIUM PO Take 500 mg by mouth daily.       . flecainide (TAMBOCOR) 50 MG tablet Take 1 tablet (50 mg total) by mouth 2 (two) times daily.  60 tablet  5  . Lutein 20  MG CAPS Take 1 capsule by mouth daily.      . metoprolol succinate (TOPROL-XL) 25 MG 24 hr tablet TAKE 1/2 TABLET BY MOUTH TWICE A DAY  90 tablet  1  . multivitamin (THERAGRAN) per tablet Take 1 tablet by mouth daily.        Marland Kitchen omeprazole (PRILOSEC) 20 MG capsule TAKE ONE CAPSULE BY MOUTH TWICE A DAY  180 capsule  0  . Rivaroxaban (XARELTO) 20 MG TABS tablet TAKE 1 TABLET BY MOUTH DAILY WITH SUPPER  30 tablet  6  . simvastatin (ZOCOR) 20 MG tablet TAKE 1 TABLET BY MOUTH DAILY  90 tablet  0   No current facility-administered medications on file prior to visit.    No Known Allergies  Review of Systems  Review of Systems  Constitutional: Negative for fever, chills and malaise/fatigue.  HENT: Negative for congestion, hearing loss and nosebleeds.   Eyes: Negative for discharge.  Respiratory: Negative for cough, sputum production, shortness of breath and wheezing.   Cardiovascular: Negative for chest  pain, palpitations and leg swelling.  Gastrointestinal: Negative for heartburn, nausea, vomiting, abdominal pain, diarrhea, constipation and blood in stool.  Genitourinary: Negative for dysuria, urgency, frequency and hematuria.  Musculoskeletal: Negative for back pain, falls and myalgias.  Skin: Negative for rash.  Neurological: Negative for dizziness, tremors, sensory change, focal weakness, loss of consciousness, weakness and headaches.  Endo/Heme/Allergies: Negative for polydipsia. Does not bruise/bleed easily.  Psychiatric/Behavioral: Negative for depression and suicidal ideas. The patient is not nervous/anxious and does not have insomnia.     Objective  BP 110/70  Pulse 46  Temp(Src) 98 F (36.7 C) (Oral)  Ht 5\' 5"  (1.651 m)  Wt 173 lb 1.9 oz (78.527 kg)  BMI 28.81 kg/m2  SpO2 97%  Physical Exam  Physical Exam  Constitutional: She is oriented to person, place, and time and well-developed, well-nourished, and in no distress. No distress.  HENT:  Head: Normocephalic and atraumatic.  Eyes: Conjunctivae are normal.  Neck: Neck supple. No thyromegaly present.  Cardiovascular: Normal rate and normal heart sounds.   No murmur heard. Irregularly irregular  Pulmonary/Chest: Effort normal and breath sounds normal. She has no wheezes.  Abdominal: She exhibits no distension and no mass.  Musculoskeletal: She exhibits no edema.  Lymphadenopathy:    She has no cervical adenopathy.  Neurological: She is alert and oriented to person, place, and time.  Skin: Skin is warm and dry. No rash noted. She is not diaphoretic.  Psychiatric: Memory, affect and judgment normal.    Lab Results  Component Value Date   TSH 3.012 06/25/2013   Lab Results  Component Value Date   WBC 6.0 06/25/2013   HGB 12.5 06/25/2013   HCT 37.4 06/25/2013   MCV 89.9 06/25/2013   PLT 270 06/25/2013   Lab Results  Component Value Date   CREATININE 0.9 04/21/2013   BUN 17 04/21/2013   NA 137 04/21/2013   K  3.9 04/21/2013   CL 102 04/21/2013   CO2 27 04/21/2013   Lab Results  Component Value Date   ALT 16 04/21/2013   AST 26 04/21/2013   ALKPHOS 84 04/21/2013   BILITOT 0.6 04/21/2013   Lab Results  Component Value Date   CHOL 160 04/21/2013   Lab Results  Component Value Date   HDL 64.60 04/21/2013   Lab Results  Component Value Date   LDLCALC 85 04/21/2013   Lab Results  Component Value Date   TRIG 51.0 04/21/2013  Lab Results  Component Value Date   CHOLHDL 2 04/21/2013     Assessment & Plan  Atrial fibrillation Tolerating Xarelto, rate controlled, no changes.  Benign hypertensive heart disease without heart failure Well controlled, no changes  Mixed hyperlipidemia tolerting Zocor, well controlled no changes.  Cataract Following with opthamology

## 2013-06-28 NOTE — Assessment & Plan Note (Signed)
tolerting Zocor, well controlled no changes.

## 2013-06-28 NOTE — Assessment & Plan Note (Signed)
Tolerating Xarelto, rate controlled, no changes.

## 2013-06-28 NOTE — Assessment & Plan Note (Addendum)
Following with opthamology 

## 2013-06-28 NOTE — Assessment & Plan Note (Signed)
Well controlled, no changes 

## 2013-07-04 ENCOUNTER — Other Ambulatory Visit: Payer: Self-pay | Admitting: Cardiology

## 2013-07-07 ENCOUNTER — Other Ambulatory Visit: Payer: Self-pay | Admitting: Cardiology

## 2013-07-25 ENCOUNTER — Other Ambulatory Visit: Payer: Self-pay | Admitting: Cardiology

## 2013-07-27 ENCOUNTER — Encounter: Payer: Self-pay | Admitting: Cardiology

## 2013-07-27 ENCOUNTER — Telehealth: Payer: Self-pay | Admitting: Cardiology

## 2013-07-27 ENCOUNTER — Ambulatory Visit (INDEPENDENT_AMBULATORY_CARE_PROVIDER_SITE_OTHER): Payer: Medicare Other | Admitting: Cardiology

## 2013-07-27 VITALS — BP 126/84 | HR 56 | Ht 65.0 in | Wt 170.0 lb

## 2013-07-27 DIAGNOSIS — G459 Transient cerebral ischemic attack, unspecified: Secondary | ICD-10-CM

## 2013-07-27 DIAGNOSIS — I491 Atrial premature depolarization: Secondary | ICD-10-CM

## 2013-07-27 DIAGNOSIS — I739 Peripheral vascular disease, unspecified: Secondary | ICD-10-CM

## 2013-07-27 DIAGNOSIS — I779 Disorder of arteries and arterioles, unspecified: Secondary | ICD-10-CM

## 2013-07-27 DIAGNOSIS — I119 Hypertensive heart disease without heart failure: Secondary | ICD-10-CM

## 2013-07-27 DIAGNOSIS — I480 Paroxysmal atrial fibrillation: Secondary | ICD-10-CM

## 2013-07-27 DIAGNOSIS — I4891 Unspecified atrial fibrillation: Secondary | ICD-10-CM

## 2013-07-27 LAB — HEPATIC FUNCTION PANEL
ALT: 15 U/L (ref 0–35)
AST: 25 U/L (ref 0–37)
Albumin: 3.8 g/dL (ref 3.5–5.2)
Alkaline Phosphatase: 85 U/L (ref 39–117)
BILIRUBIN DIRECT: 0.1 mg/dL (ref 0.0–0.3)
BILIRUBIN TOTAL: 0.5 mg/dL (ref 0.3–1.2)
Total Protein: 7.2 g/dL (ref 6.0–8.3)

## 2013-07-27 LAB — BASIC METABOLIC PANEL
BUN: 13 mg/dL (ref 6–23)
CHLORIDE: 104 meq/L (ref 96–112)
CO2: 29 mEq/L (ref 19–32)
Calcium: 9.5 mg/dL (ref 8.4–10.5)
Creatinine, Ser: 0.9 mg/dL (ref 0.4–1.2)
GFR: 65.7 mL/min (ref >60.00)
Glucose, Bld: 92 mg/dL (ref 70–99)
POTASSIUM: 4 meq/L (ref 3.5–5.1)
Sodium: 140 mEq/L (ref 135–145)

## 2013-07-27 LAB — TSH: TSH: 2.62 u[IU]/mL (ref 0.35–5.50)

## 2013-07-27 LAB — MAGNESIUM: Magnesium: 1.8 mg/dL (ref 1.5–2.5)

## 2013-07-27 MED ORDER — FLECAINIDE ACETATE 50 MG PO TABS: ORAL_TABLET | ORAL | Status: DC

## 2013-07-27 NOTE — Patient Instructions (Addendum)
Will obtain labs today and call you with the results (bmet/magnesium/tsh/hfp)  INCREASE YOUR FLECAINIDE TO 50 MG IN THE MORNING AND 100 MG IN THE EVENING  Keep your April appointment

## 2013-07-27 NOTE — Telephone Encounter (Signed)
Shall we see her as a work in this afternoon for office visit and EKG?

## 2013-07-27 NOTE — Progress Notes (Signed)
Quick Note:  Please report to patient. The recent labs are stable. Continue same medication and careful diet. ______ 
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
## 2013-07-27 NOTE — Telephone Encounter (Signed)
Friday woke up in Afib and took 1/2 metoprolol and converted. Saturday night went to bed and woke up with irregular heart beat, not Afib. Happened again last night. When she gets up and walks around heart feels like it is beating hard but not really going fast (HR 50-60) Patient is supposed to be going out of town Wednesday and she does not want to go with this happening. Will forward to  Dr. Mare Ferrari for review.

## 2013-07-27 NOTE — Telephone Encounter (Signed)
New problem   Pt is having irregular heart beat and not sleeping. Please call pt

## 2013-07-27 NOTE — Assessment & Plan Note (Signed)
Blood pressure remains normal on current therapy

## 2013-07-27 NOTE — Telephone Encounter (Signed)
Spoke with patient, coming in today for ov

## 2013-07-27 NOTE — Assessment & Plan Note (Signed)
EKG today shows that she is in normal sinus rhythm with occasional premature atrial beats.

## 2013-07-27 NOTE — Assessment & Plan Note (Signed)
No TIA symptoms. 

## 2013-07-28 ENCOUNTER — Telehealth: Payer: Self-pay | Admitting: *Deleted

## 2013-07-28 LAB — HM MAMMOGRAPHY

## 2013-07-28 NOTE — Telephone Encounter (Signed)
Message copied by Earvin Hansen on Tue Jul 28, 2013  2:45 PM ------      Message from: Darlin Coco      Created: Mon Jul 27, 2013  5:13 PM       Please report to patient.  The recent labs are stable. Continue same medication and careful diet. ------

## 2013-07-28 NOTE — Telephone Encounter (Signed)
Advised patient of lab results  

## 2013-08-03 ENCOUNTER — Encounter: Payer: Self-pay | Admitting: Family Medicine

## 2013-08-17 ENCOUNTER — Ambulatory Visit: Payer: Medicare Other | Admitting: Cardiology

## 2013-08-19 ENCOUNTER — Telehealth: Payer: Self-pay | Admitting: Family Medicine

## 2013-08-19 NOTE — Telephone Encounter (Signed)
Patient is requesting last lab results 

## 2013-08-19 NOTE — Telephone Encounter (Signed)
Notified pt of normal results from 06/25/13.

## 2013-08-21 ENCOUNTER — Encounter: Payer: Self-pay | Admitting: Cardiology

## 2013-08-21 ENCOUNTER — Other Ambulatory Visit: Payer: Medicare Other

## 2013-08-21 ENCOUNTER — Encounter: Payer: Self-pay | Admitting: Family Medicine

## 2013-08-21 ENCOUNTER — Ambulatory Visit (INDEPENDENT_AMBULATORY_CARE_PROVIDER_SITE_OTHER): Payer: Medicare Other | Admitting: Cardiology

## 2013-08-21 VITALS — BP 138/70 | HR 50 | Ht 65.0 in | Wt 173.0 lb

## 2013-08-21 DIAGNOSIS — I739 Peripheral vascular disease, unspecified: Secondary | ICD-10-CM

## 2013-08-21 DIAGNOSIS — I779 Disorder of arteries and arterioles, unspecified: Secondary | ICD-10-CM

## 2013-08-21 DIAGNOSIS — G459 Transient cerebral ischemic attack, unspecified: Secondary | ICD-10-CM

## 2013-08-21 DIAGNOSIS — I4891 Unspecified atrial fibrillation: Secondary | ICD-10-CM

## 2013-08-21 DIAGNOSIS — E7800 Pure hypercholesterolemia, unspecified: Secondary | ICD-10-CM

## 2013-08-21 DIAGNOSIS — I480 Paroxysmal atrial fibrillation: Secondary | ICD-10-CM

## 2013-08-21 DIAGNOSIS — I119 Hypertensive heart disease without heart failure: Secondary | ICD-10-CM

## 2013-08-21 LAB — LIPID PANEL
CHOL/HDL RATIO: 2
Cholesterol: 151 mg/dL (ref 0–200)
HDL: 71.6 mg/dL (ref >39.00)
LDL CALC: 72 mg/dL (ref 0–99)
Triglycerides: 39 mg/dL (ref 0.0–149.0)
VLDL: 7.8 mg/dL (ref 0.0–40.0)

## 2013-08-21 LAB — HEPATIC FUNCTION PANEL
ALT: 22 U/L (ref 0–35)
AST: 25 U/L (ref 0–37)
Albumin: 3.5 g/dL (ref 3.5–5.2)
Alkaline Phosphatase: 89 U/L (ref 39–117)
Bilirubin, Direct: 0.1 mg/dL (ref 0.0–0.3)
Total Bilirubin: 0.8 mg/dL (ref 0.3–1.2)
Total Protein: 7.1 g/dL (ref 6.0–8.3)

## 2013-08-21 LAB — BASIC METABOLIC PANEL
BUN: 14 mg/dL (ref 6–23)
CO2: 28 meq/L (ref 19–32)
Calcium: 9.2 mg/dL (ref 8.4–10.5)
Chloride: 104 mEq/L (ref 96–112)
Creatinine, Ser: 0.8 mg/dL (ref 0.4–1.2)
GFR: 70.22 mL/min (ref >60.00)
GLUCOSE: 82 mg/dL (ref 70–99)
POTASSIUM: 4 meq/L (ref 3.5–5.1)
SODIUM: 140 meq/L (ref 135–145)

## 2013-08-21 NOTE — Assessment & Plan Note (Signed)
The patient has been taking flecainide 50 mg in the morning and 100 mg in the evening.  On this dose she has had no further atrial fibrillation.  She is on Xarelto for anticoagulation.  She remains on metoprolol 12.5 mg twice a day.  She has not had any TIA recurrence.

## 2013-08-21 NOTE — Patient Instructions (Signed)
Your physician recommends that you continue on your current medications as directed. Please refer to the Current Medication list given to you today.  Your physician wants you to follow-up in: 4 months ov/ekg You will receive a reminder letter in the mail two months in advance. If you don't receive a letter, please call our office to schedule the follow-up appointment.  

## 2013-08-21 NOTE — Assessment & Plan Note (Signed)
Blood pressure was remaining stable on current therapy.  She's not having any symptoms of shortness of breath.  No dizziness or syncope.

## 2013-08-21 NOTE — Assessment & Plan Note (Signed)
The patient has had mild carotid artery disease.  No TIA symptoms.
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
## 2013-08-23 NOTE — Progress Notes (Signed)
Quick Note:  Please report to patient. The recent labs are stable. Continue same medication and careful diet. ______ 

## 2013-10-08 ENCOUNTER — Other Ambulatory Visit: Payer: Self-pay | Admitting: Cardiology

## 2013-10-27 ENCOUNTER — Other Ambulatory Visit: Payer: Self-pay | Admitting: *Deleted

## 2013-10-27 MED ORDER — RIVAROXABAN 20 MG PO TABS: ORAL_TABLET | ORAL | Status: DC

## 2013-12-22 ENCOUNTER — Ambulatory Visit (INDEPENDENT_AMBULATORY_CARE_PROVIDER_SITE_OTHER): Payer: Medicare Other | Admitting: Cardiology

## 2013-12-22 ENCOUNTER — Encounter: Payer: Self-pay | Admitting: Cardiology

## 2013-12-22 ENCOUNTER — Other Ambulatory Visit: Payer: Medicare Other

## 2013-12-22 VITALS — BP 148/62 | HR 51 | Ht 65.0 in | Wt 178.0 lb

## 2013-12-22 DIAGNOSIS — I359 Nonrheumatic aortic valve disorder, unspecified: Secondary | ICD-10-CM

## 2013-12-22 DIAGNOSIS — I4891 Unspecified atrial fibrillation: Secondary | ICD-10-CM

## 2013-12-22 DIAGNOSIS — G458 Other transient cerebral ischemic attacks and related syndromes: Secondary | ICD-10-CM

## 2013-12-22 DIAGNOSIS — I119 Hypertensive heart disease without heart failure: Secondary | ICD-10-CM

## 2013-12-22 DIAGNOSIS — I491 Atrial premature depolarization: Secondary | ICD-10-CM

## 2013-12-22 DIAGNOSIS — E782 Mixed hyperlipidemia: Secondary | ICD-10-CM

## 2013-12-22 DIAGNOSIS — I480 Paroxysmal atrial fibrillation: Secondary | ICD-10-CM

## 2013-12-22 DIAGNOSIS — I358 Other nonrheumatic aortic valve disorders: Secondary | ICD-10-CM

## 2013-12-22 LAB — LIPID PANEL
Cholesterol: 152 mg/dL (ref 0–200)
HDL: 68.6 mg/dL (ref >39.00)
LDL Cholesterol: 71 mg/dL (ref 0–99)
NONHDL: 83.4
Total CHOL/HDL Ratio: 2
Triglycerides: 63 mg/dL (ref 0.0–149.0)
VLDL: 12.6 mg/dL (ref 0.0–40.0)

## 2013-12-22 LAB — BASIC METABOLIC PANEL
BUN: 15 mg/dL (ref 6–23)
CO2: 28 meq/L (ref 19–32)
Calcium: 9.1 mg/dL (ref 8.4–10.5)
Chloride: 104 mEq/L (ref 96–112)
Creatinine, Ser: 1.1 mg/dL (ref 0.4–1.2)
GFR: 53.64 mL/min — ABNORMAL LOW (ref >60.00)
GLUCOSE: 79 mg/dL (ref 70–99)
Potassium: 4.2 mEq/L (ref 3.5–5.1)
Sodium: 141 mEq/L (ref 135–145)

## 2013-12-22 LAB — HEPATIC FUNCTION PANEL
ALT: 13 U/L (ref 0–35)
AST: 26 U/L (ref 0–37)
Albumin: 3.5 g/dL (ref 3.5–5.2)
Alkaline Phosphatase: 80 U/L (ref 39–117)
BILIRUBIN TOTAL: 0.8 mg/dL (ref 0.2–1.2)
Bilirubin, Direct: 0 mg/dL (ref 0.0–0.3)
Total Protein: 6.9 g/dL (ref 6.0–8.3)

## 2013-12-22 MED ORDER — OMEPRAZOLE 20 MG PO CPDR: DELAYED_RELEASE_CAPSULE | ORAL | Status: DC

## 2013-12-22 MED ORDER — FLECAINIDE ACETATE 50 MG PO TABS: ORAL_TABLET | ORAL | Status: DC

## 2013-12-22 MED ORDER — RIVAROXABAN 20 MG PO TABS: ORAL_TABLET | ORAL | Status: DC

## 2013-12-22 MED ORDER — METOPROLOL SUCCINATE ER 25 MG PO TB24: ORAL_TABLET | ORAL | Status: DC

## 2013-12-22 MED ORDER — SIMVASTATIN 20 MG PO TABS: ORAL_TABLET | ORAL | Status: DC

## 2013-12-22 NOTE — Assessment & Plan Note (Signed)
The patient has not had any recurrent atrial fibrillation.  She is doing well on current dose of flecainide.
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
## 2013-12-22 NOTE — Assessment & Plan Note (Signed)
The patient is on simvastatin 20 mg daily.  She is not having any myalgias.  Fasting lab work today is pending.

## 2013-12-22 NOTE — Assessment & Plan Note (Signed)
The patient has a basilar systolic heart murmur.  No symptoms of aortic stenosis.Autumn Moss

## 2013-12-22 NOTE — Assessment & Plan Note (Signed)
Blood pressure has been remaining stable on current therapy. 

## 2013-12-22 NOTE — Patient Instructions (Signed)
LAB WORK TODAY; BMET, LIPIDS AND LIVER PANEL  Your physician recommends that you continue on your current medications as directed. Please refer to the Current Medication list given to you today.  Your physician recommends that you schedule a follow-up appointment in: [place], Mare Ferrari

## 2013-12-22 NOTE — Progress Notes (Signed)
Quick Note:  Please report to patient. The recent labs are stable. Continue same medication and careful diet. ______ 

## 2013-12-24 ENCOUNTER — Telehealth: Payer: Self-pay | Admitting: *Deleted

## 2013-12-24 ENCOUNTER — Encounter: Payer: Self-pay | Admitting: Family Medicine

## 2013-12-24 ENCOUNTER — Ambulatory Visit: Payer: Medicare Other | Admitting: Family Medicine

## 2013-12-24 ENCOUNTER — Ambulatory Visit (INDEPENDENT_AMBULATORY_CARE_PROVIDER_SITE_OTHER): Payer: Medicare Other | Admitting: Family Medicine

## 2013-12-24 VITALS — BP 130/70 | HR 61 | Temp 98.6°F | Ht 65.0 in | Wt 179.0 lb

## 2013-12-24 DIAGNOSIS — I480 Paroxysmal atrial fibrillation: Secondary | ICD-10-CM

## 2013-12-24 DIAGNOSIS — E663 Overweight: Secondary | ICD-10-CM

## 2013-12-24 DIAGNOSIS — Z0000 Encounter for general adult medical examination without abnormal findings: Secondary | ICD-10-CM

## 2013-12-24 DIAGNOSIS — Z23 Encounter for immunization: Secondary | ICD-10-CM

## 2013-12-24 DIAGNOSIS — I4891 Unspecified atrial fibrillation: Secondary | ICD-10-CM

## 2013-12-24 DIAGNOSIS — E782 Mixed hyperlipidemia: Secondary | ICD-10-CM

## 2013-12-24 DIAGNOSIS — G459 Transient cerebral ischemic attack, unspecified: Secondary | ICD-10-CM

## 2013-12-24 DIAGNOSIS — H3530 Unspecified macular degeneration: Secondary | ICD-10-CM

## 2013-12-24 NOTE — Patient Instructions (Addendum)
[facility]   Cholesterol Cholesterol is a white, waxy, fat-like substance needed by your body in small amounts. The liver makes all the cholesterol you need. Cholesterol is carried from the liver by the blood through the blood vessels. Deposits of cholesterol (plaque) may build up on blood vessel walls. These make the arteries narrower and stiffer. Cholesterol plaques increase the risk for heart attack and stroke.  You cannot feel your cholesterol level even if it is very high. The only way to know it is high is with a blood test. Once you know your cholesterol levels, you should keep a record of the test results. Work with your health care provider to keep your levels in the desired range.  WHAT DO THE RESULTS MEAN?  Total cholesterol is a rough measure of all the cholesterol in your blood.   LDL is the so-called bad cholesterol. This is the type that deposits cholesterol in the walls of the arteries. You want this level to be low.   HDL is the good cholesterol because it cleans the arteries and carries the LDL away. You want this level to be high.  Triglycerides are fat that the body can either burn for energy or store. High levels are closely linked to heart disease.  WHAT ARE THE DESIRED LEVELS OF CHOLESTEROL?  Total cholesterol below 200.   LDL below 100 for people at risk, below 70 for those at very high risk.   HDL above 50 is good, above 60 is best.   Triglycerides below 150.  HOW CAN I LOWER MY CHOLESTEROL?  Diet. Follow your diet programs as directed by your health care provider.   Choose fish or white meat chicken and [place], roasted or baked. Limit fatty cuts of red meat, fried foods, and processed meats, such as sausage and lunch meats.   Eat lots of fresh fruits and vegetables.  Choose whole grains, beans, pasta, potatoes, and cereals.   Use only small amounts of olive, corn, or canola oils.   Avoid butter, mayonnaise, shortening, or palm kernel  oils.  Avoid foods with trans fats.   Drink skim or nonfat milk and eat low-fat or nonfat yogurt and cheeses. Avoid whole milk, cream, ice cream, egg yolks, and full-fat cheeses.   Healthy desserts include angel food cake, ginger snaps, animal crackers, hard candy, popsicles, and low-fat or nonfat frozen yogurt. Avoid pastries, cakes, pies, and cookies.   Exercise. Follow your exercise programs as directed by your health care provider.   A regular program helps decrease LDL and raise HDL.   A regular program helps with weight control.   Do things that increase your activity level like gardening, walking, or taking the stairs. Ask your health care provider about how you can be more active in your daily life.   Medicine. Take medicine only as directed by your health care provider.   Medicine may be prescribed by your health care provider to help lower cholesterol and decrease the risk for heart disease.   If you have several risk factors, you may need medicine even if your levels are normal. Document Released: 01/16/2001 Document Revised: 09/07/2013 Document Reviewed: 02/04/2013 [facility] Patient Information 2015 [place], [place]. This information is not intended to replace advice given to you by your health care provider. Make sure you discuss any questions you have with your health care provider.

## 2013-12-24 NOTE — Progress Notes (Signed)
Pre visit review using our clinic review tool, if applicable. No additional management support is needed unless otherwise documented below in the visit note. 

## 2013-12-24 NOTE — Telephone Encounter (Signed)
pt notified about lab results with verbal understanding  

## 2013-12-27 ENCOUNTER — Encounter: Payer: Self-pay | Admitting: Family Medicine

## 2013-12-27 DIAGNOSIS — Z0000 Encounter for general adult medical examination without abnormal findings: Secondary | ICD-10-CM

## 2013-12-27 HISTORY — DX: Encounter for general adult medical examination without abnormal findings: Z00.00

## 2013-12-27 NOTE — Assessment & Plan Note (Addendum)
Patient denies any difficulties at home. No trouble with ADLs, depression or falls. No recent changes to vision or hearing. Is UTD with immunizations. Is UTD with screening. Discussed Advanced Directives, patient agrees to bring [place] copies of documents if can. Encouraged heart healthy diet, exercise as tolerated and adequate sleep Sees Dr Mare Ferrari cardiology Sees Dr Marica Otter opthamology Sees Dr [facility] gastroenterology, reports last colonoscopy less than 5 years ago and not due yet, will request records MGM done 3/15

## 2013-12-27 NOTE — Assessment & Plan Note (Signed)
Encouraged DASH diet, decrease po intake and increase exercise as tolerated. Needs 7-8 hours of sleep nightly. Avoid trans fats, eat small, frequent meals every 4-5 hours with lean proteins, complex carbs and healthy fats. Minimize simple carbs, GMO foods. 

## 2013-12-27 NOTE — Assessment & Plan Note (Signed)
Tolerating statin, encouraged heart healthy diet, avoid trans fats, minimize simple carbs and saturated fats. Increase exercise as tolerated 

## 2013-12-27 NOTE — Progress Notes (Signed)
Patient ID: Autumn Moss, female   DOB: Jan 26, 1941, 73 y.o.   MRN: [id number] BRAILEY BUESCHER [id number] [birth year] 12/27/2013      Progress Note-Follow Up  Subjective  Chief Complaint  No chief complaint on file.   HPI  Patient is a 73 year old female in today for routine medical care. Patient had TIA about 2 years ago and no further episodes. Is following with ophthalmology but has had no recent change in visual acuity. Had a colonoscopy less than 5 years ago and was told to repeat in 5 years. No recent illness or concerns. Denies CP/palp/SOB/HA/congestion/fevers/GI or GU c/o. Taking meds as prescribed  Past Medical History  Diagnosis Date  . Hypertension   . Paroxysmal atrial fibrillation   . Palpitations     sporadic  . Hyperlipidemia   . Aortic valve disease     mild - 07/05/05 per echocardiogram   . Left ventricular diastolic dysfunction     mild - 07/05/05 per echocardiogram  . COPD (chronic obstructive pulmonary disease)   . Ectopic pregnancy     history of x2  . Dyspepsia     occasional  . History of echocardiogram 01/26/2009    Est. EF of 55-60%  --  MILD MITRAL REGURGITATION  -  SINCE LAST ECHO OF 07/10/05 NO SIGNIFICANT CHANGES.  ---  Joyice Faster Brackbill, MD  . Sinusitis acute 04/10/2011  . 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 Chronic anticoagulation     short course of Xarelto  . TIA (transient ischemic attack) 01/2012    back on Xarelto  . Bradycardia, sinus   . Cataract 06/28/2013    b/l  . Macular degeneration 06/28/2013  . Medicare annual wellness visit, subsequent 12/27/2013    Past Surgical History  Procedure Laterality Date  . Ectopic pregnancy surgery      bilateral, both tubes excised and 1 ovary excised  . Oophorectomy    . Appendectomy      Family History  Problem Relation Age of Onset  . Kidney failure Mother   . Hypertension Mother   . Stroke Mother   . Arthritis Mother     RA  . Cancer Father     bladder cancer  . Atrial fibrillation  Brother   . Sleep apnea Brother   . Heart attack Neg Hx   . Hypertension Brother   . Arthritis Brother   . Breast cancer Sister     History   Social History  . Marital Status: Married    Spouse Name: Josph Macho    Number of Children: 1  . Years of Education: N/A   Occupational History  .     Social History Main Topics  . Smoking status: Former Smoker -- 0.50 packs/day for 25 years    Types: Cigarettes    Quit date: 06/22/2005  . Smokeless tobacco: Never Used  . Alcohol Use: No  . Drug Use: No  . Sexual Activity: Not Currently   Other Topics Concern  . Not on file   Social History Narrative   Uses seat belt   Worked at [employer], Retired   Regular exercise: yes, [place] [facility] [place] [facility] and [place]   Diet, no restrictions, minimizes dairy    Current Outpatient Prescriptions on File Prior to Visit  Medication Sig Dispense Refill  . CALCIUM PO Take 500 mg by mouth daily.       . flecainide (TAMBOCOR) 50 MG tablet 1 tablet in the morning and 2 tablets in the evening  270 tablet  3  . Lutein 20 MG CAPS Take 1 capsule by mouth daily.      . metoprolol succinate (TOPROL-XL) 25 MG 24 hr tablet TAKE 1/2 TABLET BY MOUTH TWICE A DAY  90 tablet  3  . multivitamin (THERAGRAN) per tablet Take 1 tablet by mouth daily.        Marland Kitchen omeprazole (PRILOSEC) 20 MG capsule TAKE ONE CAPSULE BY MOUTH TWICE A DAY  180 capsule  3  . rivaroxaban (XARELTO) 20 MG TABS tablet TAKE 1 TABLET BY MOUTH DAILY WITH SUPPER  30 tablet  11  . simvastatin (ZOCOR) 20 MG tablet TAKE 1 TABLET BY MOUTH DAILY  90 tablet  3   No current facility-administered medications on file prior to visit.    No Known Allergies  Review of Systems  Review of Systems  Constitutional: Negative for fever, chills and malaise/fatigue.  HENT: Negative for congestion, hearing loss and nosebleeds.   Eyes: Negative for discharge.  Respiratory: Negative for cough, sputum production, shortness of breath and wheezing.   Cardiovascular: Negative for  chest pain, palpitations and leg swelling.  Gastrointestinal: Negative for heartburn, nausea, vomiting, abdominal pain, diarrhea, constipation and blood in stool.  Genitourinary: Negative for dysuria, urgency, frequency and hematuria.  Musculoskeletal: Negative for back pain, falls and myalgias.  Skin: Negative for rash.  Neurological: Negative for dizziness, tremors, sensory change, focal weakness, loss of consciousness, weakness and headaches.  Endo/Heme/Allergies: Negative for polydipsia. Does not bruise/bleed easily.  Psychiatric/Behavioral: Negative for depression and suicidal ideas. The patient is not nervous/anxious and does not have insomnia.     Objective  BP 130/70  Pulse 61  Temp(Src) 98.6 F (37 C) (Oral)  Ht 5\' 5"  (1.651 m)  Wt 179 lb (81.194 kg)  BMI 29.79 kg/m2  SpO2 93%  Physical Exam  Physical Exam  Constitutional: She is oriented to person, place, and time and well-developed, well-nourished, and in no distress. No distress.  HENT:  Head: Normocephalic and atraumatic.  Right Ear: External ear normal.  Left Ear: External ear normal.  Nose: Nose normal.  Mouth/Throat: Oropharynx is clear and moist. No oropharyngeal exudate.  Eyes: Conjunctivae are normal. Pupils are equal, round, and reactive to light. Right eye exhibits no discharge. Left eye exhibits no discharge. No scleral icterus.  Neck: Normal range of motion. Neck supple. No thyromegaly present.  Cardiovascular: Normal rate, regular rhythm, normal heart sounds and intact distal pulses.   No murmur heard. Pulmonary/Chest: Effort normal and breath sounds normal. No respiratory distress. She has no wheezes. She has no rales.  Abdominal: Soft. Bowel sounds are normal. She exhibits no distension and no mass. There is no tenderness.  Musculoskeletal: Normal range of motion. She exhibits no edema and no tenderness.  Lymphadenopathy:    She has no cervical adenopathy.  Neurological: She is alert and oriented to  person, place, and time. She has normal reflexes. No cranial nerve deficit. Coordination normal.  Skin: Skin is warm and dry. No rash noted. She is not diaphoretic.  Psychiatric: Mood, memory and affect normal.    Lab Results  Component Value Date   TSH 2.62 07/27/2013   Lab Results  Component Value Date   WBC 6.0 06/25/2013   HGB 12.5 06/25/2013   HCT 37.4 06/25/2013   MCV 89.9 06/25/2013   PLT 270 06/25/2013   Lab Results  Component Value Date   CREATININE 1.1 12/22/2013   BUN 15 12/22/2013   NA 141 12/22/2013   K 4.2  12/22/2013   CL 104 12/22/2013   CO2 28 12/22/2013   Lab Results  Component Value Date   ALT 13 12/22/2013   AST 26 12/22/2013   ALKPHOS 80 12/22/2013   BILITOT 0.8 12/22/2013   Lab Results  Component Value Date   CHOL 152 12/22/2013   Lab Results  Component Value Date   HDL 68.60 12/22/2013   Lab Results  Component Value Date   LDLCALC 71 12/22/2013   Lab Results  Component Value Date   TRIG 63.0 12/22/2013   Lab Results  Component Value Date   CHOLHDL 2 12/22/2013     Assessment & Plan  Atrial fibrillation Well controlled on Tambocor, asymptomatic  Mixed hyperlipidemia Tolerating statin, encouraged heart healthy diet, avoid trans fats, minimize simple carbs and saturated fats. Increase exercise as tolerated  Overweight Encouraged DASH diet, decrease po intake and increase exercise as tolerated. Needs 7-8 hours of sleep nightly. Avoid trans fats, eat small, frequent meals every 4-5 hours with lean proteins, complex carbs and healthy fats. Minimize simple carbs, GMO foods.  Macular degeneration Stable follows with Dr Marica Otter, opthamology  Medicare annual wellness visit, subsequent Patient denies any difficulties at home. No trouble with ADLs, depression or falls. No recent changes to vision or hearing. Is UTD with immunizations. Is UTD with screening. Discussed Advanced Directives, patient agrees to bring [place] copies of documents if can. Encouraged  heart healthy diet, exercise as tolerated and adequate sleep Sees Dr Mare Ferrari cardiology Sees Dr Marica Otter opthamology Sees Dr [facility] gastroenterology, reports last colonoscopy less than 5 years ago and not due yet, will request records MGM done 3/15

## 2013-12-27 NOTE — Assessment & Plan Note (Signed)
Well controlled on Tambocor, asymptomatic

## 2013-12-27 NOTE — Assessment & Plan Note (Signed)
Stable follows with Dr Marica Otter, opthamology

## 2014-01-19 ENCOUNTER — Telehealth: Payer: Self-pay | Admitting: Cardiology

## 2014-01-19 DIAGNOSIS — I6529 Occlusion and stenosis of unspecified carotid artery: Secondary | ICD-10-CM

## 2014-01-19 NOTE — Telephone Encounter (Signed)
Patient received letter to call and schedule follow up echo. Reviewed chart and follow up carotid needs to be done. Advised patient and she will call back to schedule.

## 2014-01-19 NOTE — Telephone Encounter (Signed)
New message      Talk to [facility] regarding a letter she got from our office stating she is due to have an echo.  She said she just saw Dr Mare Ferrari and he did not say anything about an echo.

## 2014-02-05 ENCOUNTER — Ambulatory Visit (HOSPITAL_COMMUNITY): Payer: Medicare Other | Attending: Internal Medicine | Admitting: *Deleted

## 2014-02-05 DIAGNOSIS — Z8673 Personal history of transient ischemic attack (TIA), and cerebral infarction without residual deficits: Secondary | ICD-10-CM | POA: Diagnosis not present

## 2014-02-05 DIAGNOSIS — I6523 Occlusion and stenosis of bilateral carotid arteries: Secondary | ICD-10-CM

## 2014-02-05 DIAGNOSIS — I10 Essential (primary) hypertension: Secondary | ICD-10-CM | POA: Insufficient documentation

## 2014-02-05 DIAGNOSIS — I4891 Unspecified atrial fibrillation: Secondary | ICD-10-CM | POA: Diagnosis present

## 2014-02-05 DIAGNOSIS — Z87891 Personal history of nicotine dependence: Secondary | ICD-10-CM | POA: Diagnosis not present

## 2014-02-05 DIAGNOSIS — I6529 Occlusion and stenosis of unspecified carotid artery: Secondary | ICD-10-CM

## 2014-02-05 DIAGNOSIS — E785 Hyperlipidemia, unspecified: Secondary | ICD-10-CM | POA: Insufficient documentation

## 2014-02-05 NOTE — Progress Notes (Signed)
Carotid duplex complete 

## 2014-02-24 ENCOUNTER — Telehealth: Payer: Self-pay | Admitting: Family Medicine

## 2014-02-24 NOTE — Telephone Encounter (Signed)
Pt called and stated she received the flu vaccine on 02/04/2014

## 2014-03-02 ENCOUNTER — Encounter (HOSPITAL_BASED_OUTPATIENT_CLINIC_OR_DEPARTMENT_OTHER): Payer: Self-pay | Admitting: Emergency Medicine

## 2014-03-02 ENCOUNTER — Emergency Department (HOSPITAL_BASED_OUTPATIENT_CLINIC_OR_DEPARTMENT_OTHER)
Admission: EM | Admit: 2014-03-02 | Discharge: 2014-03-02 | Disposition: A | Discharge status: Home / Self Care | Payer: Medicare Other | Attending: Emergency Medicine | Admitting: Emergency Medicine

## 2014-03-02 DIAGNOSIS — S61012A Laceration without foreign body of left thumb without damage to nail, initial encounter: Secondary | ICD-10-CM

## 2014-03-02 DIAGNOSIS — W260XXA Contact with knife, initial encounter: Secondary | ICD-10-CM | POA: Insufficient documentation

## 2014-03-02 DIAGNOSIS — Y9389 Activity, other specified: Secondary | ICD-10-CM | POA: Diagnosis not present

## 2014-03-02 DIAGNOSIS — Z87891 Personal history of nicotine dependence: Secondary | ICD-10-CM | POA: Diagnosis not present

## 2014-03-02 DIAGNOSIS — Y9289 Other specified places as the place of occurrence of the external cause: Secondary | ICD-10-CM | POA: Insufficient documentation

## 2014-03-02 DIAGNOSIS — I10 Essential (primary) hypertension: Secondary | ICD-10-CM | POA: Diagnosis not present

## 2014-03-02 DIAGNOSIS — J449 Chronic obstructive pulmonary disease, unspecified: Secondary | ICD-10-CM | POA: Insufficient documentation

## 2014-03-02 DIAGNOSIS — I480 Paroxysmal atrial fibrillation: Secondary | ICD-10-CM | POA: Insufficient documentation

## 2014-03-02 DIAGNOSIS — Z8679 Personal history of other diseases of the circulatory system: Secondary | ICD-10-CM | POA: Diagnosis not present

## 2014-03-02 DIAGNOSIS — Z8673 Personal history of transient ischemic attack (TIA), and cerebral infarction without residual deficits: Secondary | ICD-10-CM | POA: Diagnosis not present

## 2014-03-02 DIAGNOSIS — E785 Hyperlipidemia, unspecified: Secondary | ICD-10-CM | POA: Insufficient documentation

## 2014-03-02 DIAGNOSIS — Z7901 Long term (current) use of anticoagulants: Secondary | ICD-10-CM | POA: Insufficient documentation

## 2014-03-02 NOTE — ED Notes (Signed)
Laceration to her left thumb while using a knife to break apart frozen biscuits. She takes blood thinners and is having a hard time controlling the bleeding. Direct pressure applied.

## 2014-03-02 NOTE — ED Provider Notes (Signed)
CSN: [id number]     Arrival date & time 03/02/14  1802 History   First MD Initiated Contact with Patient 03/02/14 1902     Chief Complaint  Patient presents with  . Laceration     (Consider location/radiation/quality/duration/timing/severity/associated sxs/prior Treatment) HPI Comments: This is a 73 year old female who presents to the emergency department with a laceration to her left thumb occurring around 5:30 PM this evening. Patient reports she was using a knife to try to break up 2 frozen biscuits and accidentally cut her finger. States it is throbbing. Denies numbness or tingling. She reports she was having a difficult time controlling the bleeding as she takes xarelto. Last tetanus 12/2013.  Patient is a 73 y.o. female presenting with skin laceration. The history is provided by the patient.  Laceration   Past Medical History  Diagnosis Date  . Hypertension   . Paroxysmal atrial fibrillation   . Palpitations     sporadic  . Hyperlipidemia   . Aortic valve disease     mild - 07/05/05 per echocardiogram   . Left ventricular diastolic dysfunction     mild - 07/05/05 per echocardiogram  . COPD (chronic obstructive pulmonary disease)   . Ectopic pregnancy     history of x2  . Dyspepsia     occasional  . History of echocardiogram 01/26/2009    Est. EF of 55-60%  --  MILD MITRAL REGURGITATION  -  SINCE LAST ECHO OF 07/10/05 NO SIGNIFICANT CHANGES.  ---  Joyice Faster Brackbill, MD  . Sinusitis acute 04/10/2011  . 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 Chronic anticoagulation     short course of Xarelto  . TIA (transient ischemic attack) 01/2012    back on Xarelto  . Bradycardia, sinus   . Cataract 06/28/2013    b/l  . Macular degeneration 06/28/2013  . Medicare annual wellness visit, subsequent 12/27/2013   Past Surgical History  Procedure Laterality Date  . Ectopic pregnancy surgery      bilateral, both tubes excised and 1 ovary excised  . Oophorectomy    . Appendectomy     Family  History  Problem Relation Age of Onset  . Kidney failure Mother   . Hypertension Mother   . Stroke Mother   . Arthritis Mother     RA  . Cancer Father     bladder cancer  . Atrial fibrillation Brother   . Sleep apnea Brother   . Heart attack Neg Hx   . Hypertension Brother   . Arthritis Brother   . Breast cancer Sister    History  Substance Use Topics  . Smoking status: Former Smoker -- 0.50 packs/day for 25 years    Types: Cigarettes    Quit date: 06/22/2005  . Smokeless tobacco: Never Used  . Alcohol Use: No   OB History   Grav Para Term Preterm Abortions TAB SAB Ect Mult Living                 Review of Systems  Constitutional: Negative.   HENT: Negative.   Respiratory: Negative.   Cardiovascular: Negative.   Gastrointestinal: Negative.  Negative for nausea.  Musculoskeletal: Negative.   Skin: Positive for wound.  Neurological: Negative for numbness.  Hematological: Bruises/bleeds easily.      Allergies  Review of patient's allergies indicates no known allergies.  Home Medications   Prior to Admission medications   Medication Sig Start Date End Date Taking? Authorizing Provider  CALCIUM PO Take 500 mg by mouth  daily.     Historical Provider, MD  flecainide (TAMBOCOR) 50 MG tablet 1 tablet in the morning and 2 tablets in the evening 12/22/13   Darlin Coco, MD  Lutein 20 MG CAPS Take 1 capsule by mouth daily.    Historical Provider, MD  metoprolol succinate (TOPROL-XL) 25 MG 24 hr tablet TAKE 1/2 TABLET BY MOUTH TWICE A DAY 12/22/13   Darlin Coco, MD  multivitamin [facility]) per tablet Take 1 tablet by mouth daily.      Historical Provider, MD  omeprazole (PRILOSEC) 20 MG capsule TAKE ONE CAPSULE BY MOUTH TWICE A DAY 12/22/13   Darlin Coco, MD  rivaroxaban (XARELTO) 20 MG TABS tablet TAKE 1 TABLET BY MOUTH DAILY WITH SUPPER 12/22/13   Darlin Coco, MD  simvastatin (ZOCOR) 20 MG tablet TAKE 1 TABLET BY MOUTH DAILY 12/22/13   Darlin Coco, MD    BP 150/59  Pulse 46  Temp(Src) 98.1 F (36.7 C) (Oral)  Resp 20  Ht 5\' 5"  (1.651 m)  Wt 179 lb (81.194 kg)  BMI 29.79 kg/m2  SpO2 98% Physical Exam  Nursing note and vitals reviewed. Constitutional: She is oriented to person, place, and time. She appears well-developed and well-nourished. No distress.  HENT:  Head: Normocephalic and atraumatic.  Mouth/Throat: Oropharynx is clear and moist.  Eyes: Conjunctivae and EOM are normal.  Neck: Normal range of motion. Neck supple.  Cardiovascular: Normal rate, regular rhythm and normal heart sounds.   Pulmonary/Chest: Effort normal and breath sounds normal. No respiratory distress.  Musculoskeletal: Normal range of motion. She exhibits no edema.       Hands: Neurological: She is alert and oriented to person, place, and time. No sensory deficit.  Skin: Skin is warm and dry.  Psychiatric: She has a normal mood and affect. Her behavior is normal.    ED Course  Procedures (including critical care time) LACERATION REPAIR Performed by: Lucien Mons Authorized by: Lucien Mons Consent: Verbal consent obtained. Risks and benefits: risks, benefits and alternatives were discussed Consent given by: patient Patient identity confirmed: provided demographic data Prepped and Draped in normal sterile fashion Wound explored  Laceration Location: left thumb  Laceration Length: 1 cm  No Foreign Bodies seen or palpated  Anesthesia: none  Irrigation method: syringe Amount of cleaning: standard  Skin closure: dermabond  Patient tolerance: Patient tolerated the procedure well with no immediate complications.  Labs Review Labs Reviewed - No data to display  Imaging Review No results found.   EKG Interpretation None      MDM   Final diagnoses:  Thumb laceration, left, initial encounter   Patient presented with a laceration to her left thumb. Bleeding controlled on my evaluation. Laceration does not cross the joint line. Closed  with Dermabond. Neurovascularly intact. Stable for discharge. Return precautions given. Patient states understanding of treatment care plan and is agreeable.  Case discussed with attending Dr. Aline Brochure who agrees with plan of care.   Carman Ching, PA-C 03/02/14 1942

## 2014-03-02 NOTE — ED Notes (Signed)
1 inch lac to pad of left thumb w knife  Bleeding controlled

## 2014-03-02 NOTE — Discharge Instructions (Signed)

## 2014-03-03 NOTE — ED Provider Notes (Signed)
Medical screening examination/treatment/procedure(s) were performed by non-physician practitioner and as supervising physician I was immediately available for consultation/collaboration.   EKG Interpretation None        Pamella Pert, MD 03/03/14 1442

## 2014-04-19 ENCOUNTER — Ambulatory Visit (INDEPENDENT_AMBULATORY_CARE_PROVIDER_SITE_OTHER): Payer: Medicare Other | Admitting: Family Medicine

## 2014-04-19 ENCOUNTER — Ambulatory Visit (HOSPITAL_BASED_OUTPATIENT_CLINIC_OR_DEPARTMENT_OTHER)
Admission: RE | Admit: 2014-04-19 | Discharge: 2014-04-19 | Disposition: A | Discharge status: Home / Self Care | Payer: Medicare Other | Source: Ambulatory Visit | Attending: Family Medicine | Admitting: Family Medicine

## 2014-04-19 ENCOUNTER — Encounter: Payer: Self-pay | Admitting: Family Medicine

## 2014-04-19 VITALS — BP 156/78 | HR 55 | Temp 98.0°F | Wt 186.0 lb

## 2014-04-19 DIAGNOSIS — M545 Low back pain: Secondary | ICD-10-CM | POA: Diagnosis present

## 2014-04-19 DIAGNOSIS — M47896 Other spondylosis, lumbar region: Secondary | ICD-10-CM | POA: Diagnosis not present

## 2014-04-19 DIAGNOSIS — I10 Essential (primary) hypertension: Secondary | ICD-10-CM

## 2014-04-19 DIAGNOSIS — E663 Overweight: Secondary | ICD-10-CM

## 2014-04-19 DIAGNOSIS — M4186 Other forms of scoliosis, lumbar region: Secondary | ICD-10-CM | POA: Diagnosis not present

## 2014-04-19 DIAGNOSIS — I480 Paroxysmal atrial fibrillation: Secondary | ICD-10-CM

## 2014-04-19 NOTE — Progress Notes (Signed)
Pre visit review using our clinic review tool, if applicable. No additional management support is needed unless otherwise documented below in the visit note. 

## 2014-04-19 NOTE — Progress Notes (Signed)
Autumn Moss  [id number] 1940-07-03 04/19/2014      Progress Note-Follow Up  Subjective  Chief Complaint  Chief Complaint  Patient presents with  . Acute Visit    lower abdomen discomfort x 2wks denies diarrhea / vomiting / nausea . pt states worse when sitting to standing position. pt thinks its due to al the ice cream she had during vacation , pt states doesnt usually eat/drink milk products.     HPI  Patient is a 73 y.o. female in today for routine medical care. Patient is in today for evaluation of lower back pain and lower abdominal pain. She notes is worse on the right than the left. No falls or acute illness. Pain is worse with position changes. No radicular symptoms. Is moving bowels daily without any changes. No bloody or tarry stool. She does note some mild gaseousness however. Also complaining of some left foot pain. No recent illness. Denies CP/palp/SOB/HA/congestion/fevers/GI or GU c/o. Taking meds as prescribed  Past Medical History  Diagnosis Date  . Hypertension   . Paroxysmal atrial fibrillation   . Palpitations     sporadic  . Hyperlipidemia   . Aortic valve disease     mild - 07/05/05 per echocardiogram   . Left ventricular diastolic dysfunction     mild - 07/05/05 per echocardiogram  . COPD (chronic obstructive pulmonary disease)   . Ectopic pregnancy     history of x2  . Dyspepsia     occasional  . History of echocardiogram 01/26/2009    Est. EF of 55-60%  --  MILD MITRAL REGURGITATION  -  SINCE LAST ECHO OF 07/10/05 NO SIGNIFICANT CHANGES.  ---  Joyice Faster Brackbill, MD  . Sinusitis acute 04/10/2011  . 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 Chronic anticoagulation     short course of Xarelto  . TIA (transient ischemic attack) 01/2012    back on Xarelto  . Bradycardia, sinus   . Cataract 06/28/2013    b/l  . Macular degeneration 06/28/2013  . Medicare annual wellness visit, subsequent 12/27/2013    Past Surgical History  Procedure Laterality Date  .  Ectopic pregnancy surgery      bilateral, both tubes excised and 1 ovary excised  . Oophorectomy    . Appendectomy      Family History  Problem Relation Age of Onset  . Kidney failure Mother   . Hypertension Mother   . Stroke Mother   . Arthritis Mother     RA  . Cancer Father     bladder cancer  . Atrial fibrillation Brother   . Sleep apnea Brother   . Heart attack Neg Hx   . Hypertension Brother   . Arthritis Brother   . Breast cancer Sister     History   Social History  . Marital Status: Married    Spouse Name: Josph Macho    Number of Children: 1  . Years of Education: N/A   Occupational History  .     Social History Main Topics  . Smoking status: Former Smoker -- 0.50 packs/day for 25 years    Types: Cigarettes    Quit date: 06/22/2005  . Smokeless tobacco: Never Used  . Alcohol Use: No  . Drug Use: No  . Sexual Activity: Not Currently   Other Topics Concern  . Not on file   Social History Narrative   Uses seat belt   Worked at [employer], Retired   Regular exercise: yes, goes to [employer] daily  and walks   Diet, no restrictions, minimizes dairy    Current Outpatient Prescriptions on File Prior to Visit  Medication Sig Dispense Refill  . CALCIUM PO Take 500 mg by mouth daily.     . flecainide (TAMBOCOR) 50 MG tablet 1 tablet in the morning and 2 tablets in the evening 270 tablet 3  . Lutein 20 MG CAPS Take 1 capsule by mouth daily.    . metoprolol succinate (TOPROL-XL) 25 MG 24 hr tablet TAKE 1/2 TABLET BY MOUTH TWICE A DAY 90 tablet 3  . multivitamin (THERAGRAN) per tablet Take 1 tablet by mouth daily.      Marland Kitchen omeprazole (PRILOSEC) 20 MG capsule TAKE ONE CAPSULE BY MOUTH TWICE A DAY 180 capsule 3  . rivaroxaban (XARELTO) 20 MG TABS tablet TAKE 1 TABLET BY MOUTH DAILY WITH SUPPER 30 tablet 11  . simvastatin (ZOCOR) 20 MG tablet TAKE 1 TABLET BY MOUTH DAILY 90 tablet 3   No current facility-administered medications on file prior to visit.    No Known  Allergies  Review of Systems  Review of Systems  Constitutional: Negative for fever and malaise/fatigue.  HENT: Negative for congestion.   Eyes: Negative for discharge.  Respiratory: Negative for shortness of breath.   Cardiovascular: Negative for chest pain, palpitations and leg swelling.  Gastrointestinal: Positive for abdominal pain. Negative for nausea and diarrhea.  Genitourinary: Negative for dysuria.  Musculoskeletal: Negative for falls.  Skin: Negative for rash.  Neurological: Negative for loss of consciousness and headaches.  Endo/Heme/Allergies: Negative for polydipsia.  Psychiatric/Behavioral: Negative for depression and suicidal ideas. The patient is not nervous/anxious and does not have insomnia.     Objective  BP 177/79 mmHg  Pulse 55  Temp(Src) 98 F (36.7 C)  Wt 186 lb (84.369 kg)  SpO2 97%  Physical Exam  Physical Exam  Constitutional: She is oriented to person, place, and time and well-developed, well-nourished, and in no distress. No distress.  HENT:  Head: Normocephalic and atraumatic.  Right Ear: External ear normal.  Left Ear: External ear normal.  Nose: Nose normal.  Mouth/Throat: Oropharynx is clear and moist. No oropharyngeal exudate.  Eyes: Conjunctivae are normal. Pupils are equal, round, and reactive to light. Right eye exhibits no discharge. Left eye exhibits no discharge. No scleral icterus.  Neck: Normal range of motion. Neck supple. No thyromegaly present.  Cardiovascular: Normal rate, regular rhythm, normal heart sounds and intact distal pulses.   No murmur heard. Pulmonary/Chest: Effort normal and breath sounds normal. No respiratory distress. She has no wheezes. She has no rales.  Abdominal: Soft. Bowel sounds are normal. She exhibits no distension and no mass. There is no tenderness.  Musculoskeletal: Normal range of motion. She exhibits no edema or tenderness.  Lymphadenopathy:    She has no cervical adenopathy.  Neurological: She is  alert and oriented to person, place, and time. She has normal reflexes. No cranial nerve deficit. Coordination normal.  Skin: Skin is warm and dry. No rash noted. She is not diaphoretic.  Psychiatric: Mood, memory and affect normal.    Lab Results  Component Value Date   TSH 2.62 07/27/2013   Lab Results  Component Value Date   WBC 6.0 06/25/2013   HGB 12.5 06/25/2013   HCT 37.4 06/25/2013   MCV 89.9 06/25/2013   PLT 270 06/25/2013   Lab Results  Component Value Date   CREATININE 1.1 12/22/2013   BUN 15 12/22/2013   NA 141 12/22/2013   K 4.2 12/22/2013  CL 104 12/22/2013   CO2 28 12/22/2013   Lab Results  Component Value Date   ALT 13 12/22/2013   AST 26 12/22/2013   ALKPHOS 80 12/22/2013   BILITOT 0.8 12/22/2013   Lab Results  Component Value Date   CHOL 152 12/22/2013   Lab Results  Component Value Date   HDL 68.60 12/22/2013   Lab Results  Component Value Date   LDLCALC 71 12/22/2013   Lab Results  Component Value Date   TRIG 63.0 12/22/2013   Lab Results  Component Value Date   CHOLHDL 2 12/22/2013     Assessment & Plan  Overweight Encouraged DASH diet, decrease po intake and increase exercise as tolerated. Needs 7-8 hours of sleep nightly. Avoid trans fats, eat small, frequent meals every 4-5 hours with lean proteins, complex carbs and healthy fats. Minimize simple carbs, GMO foods.  Benign essential HTN Improved on recheck but elevated with anxiety and pain. Encouraged heart healthy diet such as the DASH diet and exercise as tolerated.   Low back pain Encouraged moist heat and gentle stretching as tolerated. May try NSAIDs and prescription meds as directed and report if symptoms worsen or seek immediate care. Tylenol and Salon Pas bid. xrays c/w arthritic changes. Pain noted in hips as well is improving but will report if worsens  Atrial fibrillation Rate controlled today

## 2014-04-19 NOTE — Assessment & Plan Note (Signed)
Encouraged DASH diet, decrease po intake and increase exercise as tolerated. Needs 7-8 hours of sleep nightly. Avoid trans fats, eat small, frequent meals every 4-5 hours with lean proteins, complex carbs and healthy fats. Minimize simple carbs, GMO foods. 

## 2014-04-19 NOTE — Patient Instructions (Signed)
Tylenol 500 mg twice daily x 7 days  Pain patch such as a menthol patch or Salon Pas twice daily  Call if worse    Back Pain, Adult Back pain is very common. The pain often gets better over time. The cause of back pain is usually not dangerous. Most people can learn to manage their back pain on their own.  HOME CARE   Stay active. Start with short walks on flat ground if you can. Try to walk farther each day.  Do not sit, drive, or stand in one place for more than 30 minutes. Do not stay in bed.  Do not avoid exercise or work. Activity can help your back heal faster.  Be careful when you bend or lift an object. Bend at your knees, keep the object close to you, and do not twist.  Sleep on a firm mattress. Lie on your side, and bend your knees. If you lie on your back, put a pillow under your knees.  Only take medicines as told by your doctor.  Put ice on the injured area.  Put ice in a plastic bag.  Place a towel between your skin and the bag.  Leave the ice on for 15-20 minutes, 03-04 times a day for the first 2 to 3 days. After that, you can switch between ice and heat packs.  Ask your doctor about back exercises or massage.  Avoid feeling anxious or stressed. Find good ways to deal with stress, such as exercise. GET HELP RIGHT AWAY IF:   Your pain does not go away with rest or medicine.  Your pain does not go away in 1 week.  You have new problems.  You do not feel well.  The pain spreads into your legs.  You cannot control when you poop (bowel movement) or pee (urinate).  Your arms or legs feel weak or lose feeling (numbness).  You feel sick to your stomach (nauseous) or throw up (vomit).  You have belly (abdominal) pain.  You feel like you may pass out (faint). MAKE SURE YOU:   Understand these instructions.  Will watch your condition.  Will get help right away if you are not doing well or get worse. Document Released: 10/10/2007 Document Revised:  07/16/2011 Document Reviewed: 08/25/2013 [facility] Patient Information 2015 [place], [place]. This information is not intended to replace advice given to you by your health care provider. Make sure you discuss any questions you have with your health care provider.

## 2014-04-22 ENCOUNTER — Encounter: Payer: Self-pay | Admitting: Cardiology

## 2014-04-22 ENCOUNTER — Ambulatory Visit (INDEPENDENT_AMBULATORY_CARE_PROVIDER_SITE_OTHER): Payer: Medicare Other | Admitting: Cardiology

## 2014-04-22 VITALS — BP 148/80 | HR 46 | Ht 65.0 in | Wt 183.8 lb

## 2014-04-22 DIAGNOSIS — I480 Paroxysmal atrial fibrillation: Secondary | ICD-10-CM

## 2014-04-22 DIAGNOSIS — E782 Mixed hyperlipidemia: Secondary | ICD-10-CM

## 2014-04-22 DIAGNOSIS — G458 Other transient cerebral ischemic attacks and related syndromes: Secondary | ICD-10-CM

## 2014-04-22 DIAGNOSIS — I358 Other nonrheumatic aortic valve disorders: Secondary | ICD-10-CM

## 2014-04-22 DIAGNOSIS — I739 Peripheral vascular disease, unspecified: Secondary | ICD-10-CM

## 2014-04-22 DIAGNOSIS — I119 Hypertensive heart disease without heart failure: Secondary | ICD-10-CM

## 2014-04-22 DIAGNOSIS — I779 Disorder of arteries and arterioles, unspecified: Secondary | ICD-10-CM

## 2014-04-22 LAB — BASIC METABOLIC PANEL
BUN: 15 mg/dL (ref 6–23)
CHLORIDE: 105 meq/L (ref 96–112)
CO2: 26 mEq/L (ref 19–32)
CREATININE: 1 mg/dL (ref 0.4–1.2)
Calcium: 9.2 mg/dL (ref 8.4–10.5)
GFR: 60.81 mL/min (ref >60.00)
Glucose, Bld: 90 mg/dL (ref 70–99)
Potassium: 3.9 mEq/L (ref 3.5–5.1)
Sodium: 140 mEq/L (ref 135–145)

## 2014-04-22 LAB — LIPID PANEL
CHOLESTEROL: 163 mg/dL (ref 0–200)
HDL: 66.2 mg/dL (ref >39.00)
LDL CALC: 84 mg/dL (ref 0–99)
NonHDL: 96.8
TRIGLYCERIDES: 65 mg/dL (ref 0.0–149.0)
Total CHOL/HDL Ratio: 2
VLDL: 13 mg/dL (ref 0.0–40.0)

## 2014-04-22 LAB — HEPATIC FUNCTION PANEL
ALK PHOS: 81 U/L (ref 39–117)
ALT: 13 U/L (ref 0–35)
AST: 21 U/L (ref 0–37)
Albumin: 3.6 g/dL (ref 3.5–5.2)
BILIRUBIN DIRECT: 0 mg/dL (ref 0.0–0.3)
BILIRUBIN TOTAL: 0.5 mg/dL (ref 0.2–1.2)
TOTAL PROTEIN: 6.8 g/dL (ref 6.0–8.3)

## 2014-04-22 NOTE — Patient Instructions (Addendum)
Will obtain labs today and call you with the results (lp/bmet/hfp)  Your physician recommends that you continue on your current medications as directed. Please refer to the Current Medication list given to you today.  Your physician wants you to follow-up in: 4 months with fasting labs (lp/bmet/hfp) You will receive a reminder letter in the mail two months in advance. If you don't receive a letter, please call our office to schedule the follow-up appointment.  Decrease your salt intake  Work harder on diet and weight loss

## 2014-04-22 NOTE — Assessment & Plan Note (Signed)
Blood pressure is slightly high today.  She does admit to eating a lot of salt in her diet.  Rather than add additional medication she will work harder on salt restriction and weight loss.

## 2014-04-22 NOTE — Assessment & Plan Note (Signed)
The patient is on simvastatin 20 mg daily.  She is not having any myalgias.  We are checking lab work today.
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
## 2014-04-22 NOTE — Assessment & Plan Note (Signed)
She has had no further atrial fibrillation

## 2014-04-22 NOTE — Assessment & Plan Note (Signed)
She has not had any recurrent TIA symptoms

## 2014-04-22 NOTE — Progress Notes (Signed)
Quick Note:  Please report to patient. The recent labs are stable. Continue same medication and careful diet. ______ 

## 2014-04-26 ENCOUNTER — Encounter: Payer: Self-pay | Admitting: Family Medicine

## 2014-04-26 ENCOUNTER — Telehealth: Payer: Self-pay | Admitting: Family Medicine

## 2014-04-26 DIAGNOSIS — M545 Low back pain, unspecified: Secondary | ICD-10-CM

## 2014-04-26 DIAGNOSIS — I10 Essential (primary) hypertension: Secondary | ICD-10-CM

## 2014-04-26 HISTORY — DX: Essential (primary) hypertension: I10

## 2014-04-26 HISTORY — DX: Low back pain, unspecified: M54.50

## 2014-04-26 NOTE — Assessment & Plan Note (Signed)
Improved on recheck but elevated with anxiety and pain. Encouraged heart healthy diet such as the DASH diet and exercise as tolerated.

## 2014-04-26 NOTE — Assessment & Plan Note (Signed)
Encouraged moist heat and gentle stretching as tolerated. May try NSAIDs and prescription meds as directed and report if symptoms worsen or seek immediate care. Tylenol and Salon Pas bid. xrays c/w arthritic changes. Pain noted in hips as well is improving but will report if worsens

## 2014-04-26 NOTE — Telephone Encounter (Signed)
Caller name: Leotha, Westermeyer Relation to pt: self  Call back number:[phone]   Reason for call:  Pt inquring about x ray results

## 2014-04-26 NOTE — Assessment & Plan Note (Signed)
Rate controlled today. 

## 2014-04-27 NOTE — Telephone Encounter (Signed)
Pt is following up , states she leaving to go out of town on Saturday and would like to know the results prior to leaving

## 2014-04-29 NOTE — Telephone Encounter (Signed)
Pt is returning your call regarding her labs pt would like for you to call back please

## 2014-04-29 NOTE — Telephone Encounter (Signed)
Called and spoke with the pt and informed her or recent  x-ray.  Pt verbalized understanding.//AB/CMA

## 2014-04-29 NOTE — Telephone Encounter (Signed)
LMOM @ 10:12am @ [phone]) asking the pt to RTC regarding x-ray results.//AB/CMA

## 2014-07-06 ENCOUNTER — Encounter: Payer: Self-pay | Admitting: Family Medicine

## 2014-07-06 ENCOUNTER — Ambulatory Visit (INDEPENDENT_AMBULATORY_CARE_PROVIDER_SITE_OTHER): Payer: Medicare Other | Admitting: Family Medicine

## 2014-07-06 VITALS — BP 134/70 | HR 56 | Temp 98.4°F | Ht 65.0 in | Wt 189.1 lb

## 2014-07-06 DIAGNOSIS — M545 Low back pain: Secondary | ICD-10-CM

## 2014-07-06 DIAGNOSIS — I480 Paroxysmal atrial fibrillation: Secondary | ICD-10-CM | POA: Diagnosis not present

## 2014-07-06 DIAGNOSIS — E663 Overweight: Secondary | ICD-10-CM | POA: Diagnosis not present

## 2014-07-06 DIAGNOSIS — E782 Mixed hyperlipidemia: Secondary | ICD-10-CM | POA: Diagnosis not present

## 2014-07-06 DIAGNOSIS — I10 Essential (primary) hypertension: Secondary | ICD-10-CM | POA: Diagnosis not present

## 2014-07-06 NOTE — Patient Instructions (Signed)
Vitamin D 2000 IU daily 3 servings of calcium daily, diet is best Probiotic daily such as Digestive Advantage or Sunol Drop Prilosec to once daily if no symptoms drop to every other day and then if no symptoms use as needed TUMS as needed Zantac 150 mg as needed    Arthritis, Nonspecific Arthritis is inflammation of a joint. This usually means pain, redness, warmth or swelling are present. One or more joints may be involved. There are a number of types of arthritis. Your caregiver may not be able to tell what type of arthritis you have right away. CAUSES  The most common cause of arthritis is the wear and tear on the joint (osteoarthritis). This causes damage to the cartilage, which can break down over time. The knees, hips, back and neck are most often affected by this type of arthritis. Other types of arthritis and common causes of joint pain include:  Sprains and other injuries near the joint. Sometimes minor sprains and injuries cause pain and swelling that develop hours later.  Rheumatoid arthritis. This affects hands, feet and knees. It usually affects both sides of your body at the same time. It is often associated with chronic ailments, fever, weight loss and general weakness.  Crystal arthritis. Gout and pseudo gout can cause occasional acute severe pain, redness and swelling in the foot, ankle, or knee.  Infectious arthritis. Bacteria can get into a joint through a break in overlying skin. This can cause infection of the joint. Bacteria and viruses can also spread through the blood and affect your joints.  Drug, infectious and allergy reactions. Sometimes joints can become mildly painful and slightly swollen with these types of illnesses. SYMPTOMS   Pain is the main symptom.  Your joint or joints can also be red, swollen and warm or hot to the touch.  You may have a fever with certain types of arthritis, or even feel overall ill.  The joint with arthritis will  hurt with movement. Stiffness is present with some types of arthritis. DIAGNOSIS  Your caregiver will suspect arthritis based on your description of your symptoms and on your exam. Testing may be needed to find the type of arthritis:  Blood and sometimes urine tests.  X-ray tests and sometimes CT or MRI scans.  Removal of fluid from the joint (arthrocentesis) is done to check for bacteria, crystals or other causes. Your caregiver (or a specialist) will numb the area over the joint with a local anesthetic, and use a needle to remove joint fluid for examination. This procedure is only minimally uncomfortable.  Even with these tests, your caregiver may not be able to tell what kind of arthritis you have. Consultation with a specialist (rheumatologist) may be helpful. TREATMENT  Your caregiver will discuss with you treatment specific to your type of arthritis. If the specific type cannot be determined, then the following general recommendations may apply. Treatment of severe joint pain includes:  Rest.  Elevation.  Anti-inflammatory medication (for example, ibuprofen) may be prescribed. Avoiding activities that cause increased pain.  Only take over-the-counter or prescription medicines for pain and discomfort as recommended by your caregiver.  Cold packs over an inflamed joint may be used for 10 to 15 minutes every hour. Hot packs sometimes feel better, but do not use overnight. Do not use hot packs if you are diabetic without your caregiver's permission.  A cortisone shot into arthritic joints may help reduce pain and swelling.  Any acute arthritis that gets worse  over the next 1 to 2 days needs to be looked at to be sure there is no joint infection. Long-term arthritis treatment involves modifying activities and lifestyle to reduce joint stress jarring. This can include weight loss. Also, exercise is needed to nourish the joint cartilage and remove waste. This helps keep the muscles around  the joint strong. HOME CARE INSTRUCTIONS   Do not take aspirin to relieve pain if gout is suspected. This elevates uric acid levels.  Only take over-the-counter or prescription medicines for pain, discomfort or fever as directed by your caregiver.  Rest the joint as much as possible.  If your joint is swollen, keep it elevated.  Use crutches if the painful joint is in your leg.  Drinking plenty of fluids may help for certain types of arthritis.  Follow your caregiver's dietary instructions.  Try low-impact exercise such as:  Swimming.  Water aerobics.  Biking.  Walking.  Morning stiffness is often relieved by a warm shower.  Put your joints through regular range-of-motion. SEEK MEDICAL CARE IF:   You do not feel better in 24 hours or are getting worse.  You have side effects to medications, or are not getting better with treatment. SEEK IMMEDIATE MEDICAL CARE IF:   You have a fever.  You develop severe joint pain, swelling or redness.  Many joints are involved and become painful and swollen.  There is severe back pain and/or leg weakness.  You have loss of bowel or bladder control. Document Released: 05/31/2004 Document Revised: 07/16/2011 Document Reviewed: 06/16/2008 [facility] Patient Information 2015 [place], [place]. This information is not intended to replace advice given to you by your health care provider. Make sure you discuss any questions you have with your health care provider.

## 2014-07-06 NOTE — Progress Notes (Signed)
Autumn Moss  [id number] Apr 19, 1941 07/06/2014      Progress Note-Follow Up  Subjective  Chief Complaint  Chief Complaint  Patient presents with  . Follow-up    HPI  Patient is a 74 y.o. female in today for routine medical care.  Patient is in today for follow-up. Overall is doing well but continues to struggle with some low back pain and bilateral hip pain. She notes when she is sitting or walking she essentially has no pain but especially with position changes she has a pulling sensation in her anterior and posterior hips and also in her low back. No radicular symptoms or weakness. No falls. She reports her blood pressures at home are generally in the 130s over 70s. No recent illness or acute complaints otherwise noted. Denies CP/palp/SOB/HA/congestion/fevers/GI or GU c/o. Taking meds as prescribed  Past Medical History  Diagnosis Date  . Hypertension   . Paroxysmal atrial fibrillation   . Palpitations     sporadic  . Hyperlipidemia   . Aortic valve disease     mild - 07/05/05 per echocardiogram   . Left ventricular diastolic dysfunction     mild - 07/05/05 per echocardiogram  . COPD (chronic obstructive pulmonary disease)   . Ectopic pregnancy     history of x2  . Dyspepsia     occasional  . History of echocardiogram 01/26/2009    Est. EF of 55-60%  --  MILD MITRAL REGURGITATION  -  SINCE LAST ECHO OF 07/10/05 NO SIGNIFICANT CHANGES.  ---  Joyice Faster Brackbill, MD  . Sinusitis acute 04/10/2011  . 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 Chronic anticoagulation     short course of Xarelto  . TIA (transient ischemic attack) 01/2012    back on Xarelto  . Bradycardia, sinus   . Cataract 06/28/2013    b/l  . Macular degeneration 06/28/2013  . Medicare annual wellness visit, subsequent 12/27/2013  . Benign essential HTN 04/26/2014  . Low back pain 04/26/2014    Past Surgical History  Procedure Laterality Date  . Ectopic pregnancy surgery      bilateral, both tubes excised and 1  ovary excised  . Oophorectomy    . Appendectomy      Family History  Problem Relation Age of Onset  . Kidney failure Mother   . Hypertension Mother   . Stroke Mother   . Arthritis Mother     RA  . Cancer Father     bladder cancer  . Atrial fibrillation Brother   . Sleep apnea Brother   . Heart attack Neg Hx   . Hypertension Brother   . Arthritis Brother   . Breast cancer Sister     History   Social History  . Marital Status: Married    Spouse Name: Josph Macho  . Number of Children: 1  . Years of Education: N/A   Occupational History  .     Social History Main Topics  . Smoking status: Former Smoker -- 0.50 packs/day for 25 years    Types: Cigarettes    Quit date: 06/22/2005  . Smokeless tobacco: Never Used  . Alcohol Use: No  . Drug Use: No  . Sexual Activity: Not Currently   Other Topics Concern  . Not on file   Social History Narrative   Uses seat belt   Worked at [employer], Retired   [facility]   Diet, no restrictions, minimizes dairy    Current Outpatient  Prescriptions on File Prior to Visit  Medication Sig Dispense Refill  . flecainide (TAMBOCOR) 50 MG tablet 1 tablet in the morning and 2 tablets in the evening 270 tablet 3  . Lutein 20 MG CAPS Take 1 capsule by mouth daily.    . metoprolol succinate (TOPROL-XL) 25 MG 24 hr tablet TAKE 1/2 TABLET BY MOUTH TWICE A DAY 90 tablet 3  . multivitamin (THERAGRAN) per tablet Take 1 tablet by mouth daily.      Marland Kitchen omeprazole (PRILOSEC) 20 MG capsule TAKE ONE CAPSULE BY MOUTH TWICE A DAY 180 capsule 3  . rivaroxaban (XARELTO) 20 MG TABS tablet TAKE 1 TABLET BY MOUTH DAILY WITH SUPPER 30 tablet 11  . simvastatin (ZOCOR) 20 MG tablet TAKE 1 TABLET BY MOUTH DAILY 90 tablet 3   No current facility-administered medications on file prior to visit.    No Known Allergies  Review of Systems  Review of Systems  Constitutional: Negative for fever and malaise/fatigue.  HENT: Negative for  congestion.   Eyes: Negative for discharge.  Respiratory: Negative for shortness of breath.   Cardiovascular: Negative for chest pain, palpitations and leg swelling.  Gastrointestinal: Negative for nausea, abdominal pain and diarrhea.  Genitourinary: Negative for dysuria.  Musculoskeletal: Negative for falls.  Skin: Negative for rash.  Neurological: Negative for loss of consciousness and headaches.  Endo/Heme/Allergies: Negative for polydipsia.  Psychiatric/Behavioral: Negative for depression and suicidal ideas. The patient is not nervous/anxious and does not have insomnia.     Objective  BP 152/80 mmHg  Pulse 56  Temp(Src) 98.4 F (36.9 C) (Oral)  Ht 5\' 5"  (1.651 m)  Wt 189 lb 2 oz (85.787 kg)  BMI 31.47 kg/m2  SpO2 94%  Physical Exam  Physical Exam  Constitutional: She is oriented to person, place, and time and well-developed, well-nourished, and in no distress. No distress.  HENT:  Head: Normocephalic and atraumatic.  Right Ear: External ear normal.  Left Ear: External ear normal.  Nose: Nose normal.  Mouth/Throat: Oropharynx is clear and moist. No oropharyngeal exudate.  Eyes: Conjunctivae are normal. Pupils are equal, round, and reactive to light. Right eye exhibits no discharge. Left eye exhibits no discharge. No scleral icterus.  Neck: Normal range of motion. Neck supple. No thyromegaly present.  Cardiovascular: Normal rate, regular rhythm, normal heart sounds and intact distal pulses.   No murmur heard. Pulmonary/Chest: Effort normal and breath sounds normal. No respiratory distress. She has no wheezes. She has no rales.  Abdominal: Soft. Bowel sounds are normal. She exhibits no distension and no mass. There is no tenderness.  Musculoskeletal: Normal range of motion. She exhibits no edema or tenderness.  Lymphadenopathy:    She has no cervical adenopathy.  Neurological: She is alert and oriented to person, place, and time. She has normal reflexes. No cranial nerve  deficit. Coordination normal.  Skin: Skin is warm and dry. No rash noted. She is not diaphoretic.  Psychiatric: Mood, memory and affect normal.    Lab Results  Component Value Date   TSH 2.62 07/27/2013   Lab Results  Component Value Date   WBC 6.0 06/25/2013   HGB 12.5 06/25/2013   HCT 37.4 06/25/2013   MCV 89.9 06/25/2013   PLT 270 06/25/2013   Lab Results  Component Value Date   CREATININE 1.0 04/22/2014   BUN 15 04/22/2014   NA 140 04/22/2014   K 3.9 04/22/2014   CL 105 04/22/2014   CO2 26 04/22/2014   Lab Results  Component  Value Date   ALT 13 04/22/2014   AST 21 04/22/2014   ALKPHOS 81 04/22/2014   BILITOT 0.5 04/22/2014   Lab Results  Component Value Date   CHOL 163 04/22/2014   Lab Results  Component Value Date   HDL 66.20 04/22/2014   Lab Results  Component Value Date   LDLCALC 84 04/22/2014   Lab Results  Component Value Date   TRIG 65.0 04/22/2014   Lab Results  Component Value Date   CHOLHDL 2 04/22/2014     Assessment & Plan  Atrial fibrillation Tolerating Xarelto, asymptomatic, regular rhythm   Benign essential HTN Well controlled, no changes to meds. Encouraged heart healthy diet such as the DASH diet and exercise as tolerated. Notes home numbers 120-130s over 70s No changes   Low back pain With b/l hip pain. Improves with sitting and walking, worse with position changes. Offered referral to ortho for further consideration but she declines at this time. Encouraged to stay as active as tolerated. Use Tylenol bid and up to tid. Try topical treatments prn   Overweight Encouraged DASH diet, decrease po intake and increase exercise as tolerated. Needs 7-8 hours of sleep nightly. Avoid trans fats, eat small, frequent meals every 4-5 hours with lean proteins, complex carbs and healthy fats. Minimize simple carbs   Mixed hyperlipidemia Tolerating statin, encouraged heart healthy diet, avoid trans fats, minimize simple carbs and  saturated fats. Increase exercise as tolerated

## 2014-07-06 NOTE — Progress Notes (Signed)
Pre visit review using our clinic review tool, if applicable. No additional management support is needed unless otherwise documented below in the visit note. 

## 2014-07-07 NOTE — Assessment & Plan Note (Signed)
With b/l hip pain. Improves with sitting and walking, worse with position changes. Offered referral to ortho for further consideration but she declines at this time. Encouraged to stay as active as tolerated. Use Tylenol bid and up to tid. Try topical treatments prn

## 2014-07-07 NOTE — Assessment & Plan Note (Signed)
Encouraged DASH diet, decrease po intake and increase exercise as tolerated. Needs 7-8 hours of sleep nightly. Avoid trans fats, eat small, frequent meals every 4-5 hours with lean proteins, complex carbs and healthy fats. Minimize simple carbs 

## 2014-07-07 NOTE — Assessment & Plan Note (Signed)
Tolerating Xarelto, asymptomatic, regular rhythm

## 2014-07-07 NOTE — Assessment & Plan Note (Signed)
Well controlled, no changes to meds. Encouraged heart healthy diet such as the DASH diet and exercise as tolerated. Notes home numbers 120-130s over 70s No changes

## 2014-07-07 NOTE — Assessment & Plan Note (Signed)
Tolerating statin, encouraged heart healthy diet, avoid trans fats, minimize simple carbs and saturated fats. Increase exercise as tolerated 

## 2014-08-17 DIAGNOSIS — Z1231 Encounter for screening mammogram for malignant neoplasm of breast: Secondary | ICD-10-CM | POA: Diagnosis not present

## 2014-08-17 DIAGNOSIS — Z803 Family history of malignant neoplasm of breast: Secondary | ICD-10-CM | POA: Diagnosis not present

## 2014-08-19 ENCOUNTER — Telehealth: Payer: Self-pay | Admitting: Family Medicine

## 2014-08-19 DIAGNOSIS — N63 Unspecified lump in breast: Secondary | ICD-10-CM | POA: Diagnosis not present

## 2014-08-19 NOTE — Telephone Encounter (Signed)
Order was signed by Dr. Charlett Blake this morning and fax successfully at 7:43 am today. JG//CMA

## 2014-08-19 NOTE — Telephone Encounter (Signed)
Caller name: Janett Billow at [place] [place] Relation to pt: Call back number: [phone] Pharmacy:  Reason for call:   Wanting to make sure that we received order for breast ultrasound to follow up abnormal mammogram. Patient is being seen this morning.

## 2014-08-24 ENCOUNTER — Encounter: Payer: Self-pay | Admitting: Cardiology

## 2014-08-24 ENCOUNTER — Other Ambulatory Visit (INDEPENDENT_AMBULATORY_CARE_PROVIDER_SITE_OTHER): Payer: Medicare Other | Admitting: *Deleted

## 2014-08-24 ENCOUNTER — Ambulatory Visit (INDEPENDENT_AMBULATORY_CARE_PROVIDER_SITE_OTHER): Payer: Medicare Other | Admitting: Cardiology

## 2014-08-24 VITALS — BP 120/70 | HR 41 | Ht 65.0 in | Wt 186.8 lb

## 2014-08-24 DIAGNOSIS — I480 Paroxysmal atrial fibrillation: Secondary | ICD-10-CM | POA: Diagnosis not present

## 2014-08-24 DIAGNOSIS — I358 Other nonrheumatic aortic valve disorders: Secondary | ICD-10-CM

## 2014-08-24 DIAGNOSIS — I119 Hypertensive heart disease without heart failure: Secondary | ICD-10-CM | POA: Diagnosis not present

## 2014-08-24 DIAGNOSIS — I779 Disorder of arteries and arterioles, unspecified: Secondary | ICD-10-CM

## 2014-08-24 DIAGNOSIS — E782 Mixed hyperlipidemia: Secondary | ICD-10-CM | POA: Diagnosis not present

## 2014-08-24 DIAGNOSIS — I739 Peripheral vascular disease, unspecified: Secondary | ICD-10-CM

## 2014-08-24 LAB — BASIC METABOLIC PANEL
BUN: 15 mg/dL (ref 6–23)
CALCIUM: 9.3 mg/dL (ref 8.4–10.5)
CHLORIDE: 105 meq/L (ref 96–112)
CO2: 30 meq/L (ref 19–32)
CREATININE: 1 mg/dL (ref 0.40–1.20)
GFR: 57.27 mL/min — AB (ref >60.00)
Glucose, Bld: 83 mg/dL (ref 70–99)
POTASSIUM: 4.1 meq/L (ref 3.5–5.1)
Sodium: 139 mEq/L (ref 135–145)

## 2014-08-24 LAB — HEPATIC FUNCTION PANEL
ALT: 16 U/L (ref 0–35)
AST: 22 U/L (ref 0–37)
Albumin: 3.7 g/dL (ref 3.5–5.2)
Alkaline Phosphatase: 100 U/L (ref 39–117)
Bilirubin, Direct: 0.1 mg/dL (ref 0.0–0.3)
TOTAL PROTEIN: 6.8 g/dL (ref 6.0–8.3)
Total Bilirubin: 0.5 mg/dL (ref 0.2–1.2)

## 2014-08-24 LAB — LIPID PANEL
CHOLESTEROL: 145 mg/dL (ref 0–200)
HDL: 69.1 mg/dL (ref >39.00)
LDL Cholesterol: 66 mg/dL (ref 0–99)
NonHDL: 75.9
TRIGLYCERIDES: 51 mg/dL (ref 0.0–149.0)
Total CHOL/HDL Ratio: 2
VLDL: 10.2 mg/dL (ref 0.0–40.0)

## 2014-08-24 NOTE — Progress Notes (Signed)
Quick Note:  Please report to patient. The recent labs are stable. Continue same medication and careful diet. ______ 

## 2014-08-24 NOTE — Progress Notes (Signed)
Cardiology Office Note   Date:  08/24/2014   ID:  TIERNAN MILLIKIN, DOB 1941/03/30, MRN [id number]  PCP:  Penni Homans, MD  Cardiologist:   Darlin Coco, MD   No chief complaint on file.     History of Present Illness: Autumn Moss is a 74 y.o. female who presents for a four-month follow-up office visit.  This pleasant 74 year old woman is seen for a followup office visit. She has a history of palpitations and atypical chest pain. She also has a past history of paroxysmal atrial fibrillation. She has a history of hypercholesterolemia. She does not have any history of ischemic heart disease. She has not had an ischemic testing. She did have an echocardiogram in 11/20/11 which showed normal left ventricular ejection fraction of 55-60% and trivial aortic insufficiency.  In July 2013 the patient presented with recurrent atrial fibrillation. Her Toprol was increased to 50 mg twice a day and she was placed on Xarelto. When she returned today later for her echocardiogram she was back in sinus rhythm and her Xarelto was stopped and she was advised to go back on just one Toprol daily. 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  In late September 2013 the patient went to the hospital at [facility] and was diagnosed with a TIA. She had complete recovery and resolution of her symptoms. Xarelto was added back at that time. She had some carotid disease on the left with 50-69% narrowing. 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 She was bradycardic and her beta blocker was cut way back at that time. Because of the previous small left insular cortical infarct she will remain on long-term anticoagulation in view of her paroxysmal atrial fibrillation. Her last episode of atrial fibrillation began on 01/10/13. We saw her on 01/13/13 and added flecainide 50 mg twice a day. She called  back the next day to tell us that she had converted back to normal sinus rhythm. The patient has mild carotid artery disease. Her most recent carotid duplex study on 12/18/12 showed less than 40% stenosis in each carotid. The patient exercises regularly. She does 30 minutes on the treadmill and then another 30 or so on the elliptical machine and she does that at the [facility] 3 days a week or more. She does not have any history of exertional chest pain or known ischemic heart disease. She has had no further TIA symptoms.  At her office visit her on 07/27/13 her flecainide was increased to 50 mg in the morning and 100 mg in the evening and she has had no further atrial fibrillation and feels well.  She reports that since her last visit she has not had any recurrent episodes of atrial fibrillation.  Past Medical History  Diagnosis Date  . Hypertension   . Paroxysmal atrial fibrillation   . Palpitations     sporadic  . Hyperlipidemia   . Aortic valve disease     mild - 07/05/05 per echocardiogram   . Left ventricular diastolic dysfunction     mild - 07/05/05 per echocardiogram  . COPD (chronic obstructive pulmonary disease)   . Ectopic pregnancy     history of x2  . Dyspepsia     occasional  . History of echocardiogram 01/26/2009    Est. EF of 55-60%  --  MILD MITRAL REGURGITATION  -  SINCE LAST ECHO OF 07/10/05 NO SIGNIFICANT CHANGES.  ---  Kemet Nijjar A. Chastity Noland, MD  . Sinusitis acute 04/10/2011  . 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 Chronic anticoagulation     short course of Xarelto  . TIA (transient ischemic attack) 01/2012    back on Xarelto  . Bradycardia, sinus   . Cataract 06/28/2013    b/l  . Macular degeneration 06/28/2013  . Medicare annual wellness visit, subsequent 12/27/2013  . Benign essential HTN 04/26/2014  . Low back pain 04/26/2014    Past Surgical History  Procedure Laterality Date  . Ectopic pregnancy surgery      bilateral, both tubes excised and 1 ovary excised  . Oophorectomy     . Appendectomy       Current Outpatient Prescriptions  Medication Sig Dispense Refill  . Cholecalciferol (VITAMIN D-3) 1000 UNITS CAPS Take by mouth daily.    . flecainide (TAMBOCOR) 50 MG tablet 1 tablet in the morning and 2 tablets in the evening 270 tablet 3  . Lutein 20 MG CAPS Take 1 capsule by mouth daily.    . metoprolol succinate (TOPROL-XL) 25 MG 24 hr tablet TAKE 1/2 TABLET BY MOUTH TWICE A DAY 90 tablet 3  . multivitamin (THERAGRAN) per tablet Take 1 tablet by mouth daily.      Marland Kitchen omeprazole (PRILOSEC) 20 MG capsule TAKE ONE CAPSULE BY MOUTH TWICE A DAY 180 capsule 3  . Probiotic Product (PROBIOTIC DAILY PO) Take by mouth daily.    . rivaroxaban (XARELTO) 20 MG TABS tablet TAKE 1 TABLET BY MOUTH DAILY WITH SUPPER 30 tablet 11  . simvastatin (ZOCOR) 20 MG tablet TAKE 1 TABLET BY MOUTH DAILY 90 tablet 3   No current facility-administered medications for this visit.    Allergies:   Review of patient's allergies indicates no known allergies.    Social History:  The patient  reports that she quit smoking about 9 years ago. Her smoking use included Cigarettes. She has a 12.5 pack-year smoking history. She has never used smokeless tobacco. She reports that she does not drink alcohol or use illicit drugs.   Family History:  The patient's family history includes Arthritis in her brother and mother; Atrial fibrillation in her brother; Breast cancer in her sister; Cancer in her father; Hypertension in her brother and mother; Kidney failure in her mother; Sleep apnea in her brother; Stroke in her mother. There is no history of Heart attack.    ROS:  Please see the history of present illness.   Otherwise, review of systems are positive for none.   All other systems are reviewed and negative.    PHYSICAL EXAM: VS:  BP 120/70 mmHg  Pulse 41  Ht 5\' 5"  (1.651 m)  Wt 186 lb 12.8 oz (84.732 kg)  BMI 31.09 kg/m2 , BMI Body mass index is 31.09 kg/(m^2). GEN: Well nourished, well developed,  in no acute distress HEENT: normal Neck: no JVD, carotid bruits, or masses Cardiac: RRR; no murmurs, rubs, or gallops,no edema there is a soft basilar systolic murmur of aortic valve sclerosis.  No diastolic murmur. Respiratory:  clear to auscultation bilaterally, normal work of breathing GI: soft, nontender, nondistended, + BS MS: no deformity or atrophy Skin: warm and dry, no rash Neuro:  Strength and sensation are intact Psych: euthymic mood, full affect   EKG:  EKG is not ordered today.    Recent Labs: 04/22/2014: ALT 13; BUN 15; Creatinine 1.0; Potassium 3.9; Sodium 140    Lipid Panel    Component Value Date/Time   CHOL 163 04/22/2014  0946   TRIG 65.0 04/22/2014 0946   HDL 66.20 04/22/2014 0946   CHOLHDL 2 04/22/2014 0946   VLDL 13.0 04/22/2014 0946   LDLCALC 84 04/22/2014 0946   LDLDIRECT 156.6 10/17/2010 0814      Wt Readings from Last 3 Encounters:  08/24/14 186 lb 12.8 oz (84.732 kg)  07/06/14 189 lb 2 oz (85.787 kg)  04/22/14 183 lb 12.8 oz (83.371 kg)        ASSESSMENT AND PLAN:  1. Paroxysmal atrial fibrillation.  No recurrence on current dose of flecainide 2. Hypercholesterolemia 3. Essential hypertension 4. history of TIA in September 2013. Most recent carotid duplex on 12/18/12 shows less than 40% stenosis in each carotid.   Current medicines are reviewed at length with the patient today.  The patient does not have concerns regarding medicines.  The following changes have been made:  no change  Labs/ tests ordered today include:   Orders Placed This Encounter  Procedures  . Lipid panel  . Hepatic function panel  . Basic metabolic panel     Plan: Continue regular exercise.  They are members of the silver sneakers program here at home and also [place] the beach. Continue current medication. Recheck in 4 months for office visit EKG lipid panel hepatic function panel and basal metabolic panel.   Signed, Darlin Coco, MD    08/24/2014 1:11 PM    [facility] [place], [place], [place]  [address] Phone: [phone]; Fax: [phone]

## 2014-08-24 NOTE — Addendum Note (Signed)
Addended by: Eulis Foster on: 08/24/2014 10:00 AM   Modules accepted: Orders

## 2014-08-24 NOTE — Patient Instructions (Signed)
Medication Instructions:  Your physician recommends that you continue on your current medications as directed. Please refer to the Current Medication list given to you today.  Labwork: Lp/bmet/hfp  Testing/Procedures: none  Follow-Up: Your physician recommends that you schedule a follow-up appointment in: 4 months with fasting labs (lp/bmet/hfp) and ekg

## 2014-08-30 ENCOUNTER — Encounter: Payer: Self-pay | Admitting: Family Medicine

## 2014-09-14 ENCOUNTER — Encounter: Payer: Self-pay | Admitting: Family Medicine

## 2014-11-23 DIAGNOSIS — L565 Disseminated superficial actinic porokeratosis (DSAP): Secondary | ICD-10-CM | POA: Diagnosis not present

## 2014-11-23 DIAGNOSIS — D2272 Melanocytic nevi of left lower limb, including hip: Secondary | ICD-10-CM | POA: Diagnosis not present

## 2014-11-23 DIAGNOSIS — D2239 Melanocytic nevi of other parts of face: Secondary | ICD-10-CM | POA: Diagnosis not present

## 2014-11-23 DIAGNOSIS — L821 Other seborrheic keratosis: Secondary | ICD-10-CM | POA: Diagnosis not present

## 2014-11-23 DIAGNOSIS — L720 Epidermal cyst: Secondary | ICD-10-CM | POA: Diagnosis not present

## 2014-11-25 ENCOUNTER — Telehealth: Payer: Self-pay | Admitting: Cardiology

## 2014-11-25 NOTE — Telephone Encounter (Signed)
Scheduled follow up ov with Tera Helper NP

## 2014-11-25 NOTE — Telephone Encounter (Signed)
New Message  Pt called to make 4 month recall in Aug. Pt was made aware that no appts are avail for august and September for Dr. Mare Ferrari. Pt refused an appointment w/ an APP and requested to speak w/ a RN.

## 2014-12-17 ENCOUNTER — Other Ambulatory Visit: Payer: Self-pay | Admitting: Cardiology

## 2014-12-25 ENCOUNTER — Other Ambulatory Visit: Payer: Self-pay | Admitting: Cardiology

## 2015-01-03 ENCOUNTER — Other Ambulatory Visit: Payer: Self-pay | Admitting: Cardiovascular Disease

## 2015-01-18 ENCOUNTER — Ambulatory Visit: Payer: Medicare Other | Admitting: Nurse Practitioner

## 2015-01-20 ENCOUNTER — Encounter: Payer: Medicare Other | Admitting: Family Medicine

## 2015-01-21 ENCOUNTER — Other Ambulatory Visit: Payer: Self-pay | Admitting: Cardiology

## 2015-02-01 ENCOUNTER — Ambulatory Visit (INDEPENDENT_AMBULATORY_CARE_PROVIDER_SITE_OTHER): Payer: Medicare Other | Admitting: Nurse Practitioner

## 2015-02-01 ENCOUNTER — Encounter: Payer: Self-pay | Admitting: Nurse Practitioner

## 2015-02-01 VITALS — BP 190/90 | HR 52 | Ht 65.0 in | Wt 188.4 lb

## 2015-02-01 DIAGNOSIS — Z7901 Long term (current) use of anticoagulants: Secondary | ICD-10-CM

## 2015-02-01 DIAGNOSIS — I491 Atrial premature depolarization: Secondary | ICD-10-CM

## 2015-02-01 DIAGNOSIS — E785 Hyperlipidemia, unspecified: Secondary | ICD-10-CM | POA: Diagnosis not present

## 2015-02-01 DIAGNOSIS — I480 Paroxysmal atrial fibrillation: Secondary | ICD-10-CM | POA: Diagnosis not present

## 2015-02-01 LAB — BASIC METABOLIC PANEL
BUN: 14 mg/dL (ref 6–23)
CO2: 31 mEq/L (ref 19–32)
Calcium: 9.5 mg/dL (ref 8.4–10.5)
Chloride: 103 mEq/L (ref 96–112)
Creatinine, Ser: 0.88 mg/dL (ref 0.40–1.20)
GFR: 66.29 mL/min (ref >60.00)
Glucose, Bld: 87 mg/dL (ref 70–99)
Potassium: 4.1 mEq/L (ref 3.5–5.1)
Sodium: 140 mEq/L (ref 135–145)

## 2015-02-01 LAB — LIPID PANEL
Cholesterol: 164 mg/dL (ref 0–200)
HDL: 70.4 mg/dL (ref >39.00)
LDL Cholesterol: 83 mg/dL (ref 0–99)
NonHDL: 93.86
Total CHOL/HDL Ratio: 2
Triglycerides: 55 mg/dL (ref 0.0–149.0)
VLDL: 11 mg/dL (ref 0.0–40.0)

## 2015-02-01 LAB — HEPATIC FUNCTION PANEL
ALT: 14 U/L (ref 0–35)
AST: 23 U/L (ref 0–37)
Albumin: 3.9 g/dL (ref 3.5–5.2)
Alkaline Phosphatase: 95 U/L (ref 39–117)
Bilirubin, Direct: 0.1 mg/dL (ref 0.0–0.3)
Total Bilirubin: 0.5 mg/dL (ref 0.2–1.2)
Total Protein: 7.3 g/dL (ref 6.0–8.3)

## 2015-02-01 LAB — CBC
HCT: 36.2 % (ref 36.0–46.0)
Hemoglobin: 11.8 g/dL — ABNORMAL LOW (ref 12.0–15.0)
MCHC: 32.7 g/dL (ref 30.0–36.0)
MCV: 88.6 fl (ref 78.0–100.0)
Platelets: 322 10*3/uL (ref 150.0–400.0)
RBC: 4.08 Mil/uL (ref 3.87–5.11)
RDW: 16.8 % — ABNORMAL HIGH (ref 11.5–15.5)
WBC: 6.7 10*3/uL (ref 4.0–10.5)

## 2015-02-01 LAB — TSH: TSH: 3.08 u[IU]/mL (ref 0.35–4.50)

## 2015-02-01 MED ORDER — FLECAINIDE ACETATE 50 MG PO TABS: ORAL_TABLET | ORAL | Status: DC

## 2015-02-01 MED ORDER — RIVAROXABAN 20 MG PO TABS: ORAL_TABLET | ORAL | Status: DC

## 2015-02-01 MED ORDER — METOPROLOL SUCCINATE ER 25 MG PO TB24: 12.5000 mg | ORAL_TABLET | Freq: Two times a day (BID) | ORAL | Status: DC

## 2015-02-01 MED ORDER — SIMVASTATIN 20 MG PO TABS: 20.0000 mg | ORAL_TABLET | Freq: Every day | ORAL | Status: DC

## 2015-02-01 MED ORDER — OMEPRAZOLE 20 MG PO CPDR: 20.0000 mg | DELAYED_RELEASE_CAPSULE | Freq: Two times a day (BID) | ORAL | Status: DC

## 2015-02-01 NOTE — Patient Instructions (Addendum)
We will be checking the following labs today - BMET, CBC, TSH, HPF, lipids   Medication Instructions:    Continue with your current medicines.   I have refilled her medicines    Testing/Procedures To Be Arranged:  Carotid doppler for October  Follow-Up:   See Dr. Mare Ferrari in 4 months    Other Special Instructions:   Monitor your BP at home - different times - call Rip Harbour in a week with your readings  Restrict your salt  I refilled your medicines today.   Call the [place] office at [phone] if you have any questions, problems or concerns.

## 2015-02-01 NOTE — Progress Notes (Signed)
CARDIOLOGY OFFICE NOTE  Date:  02/01/2015    Mila Merry Date of Birth: December 08, 1940 Medical Record #[id number]  PCP:  Penni Homans, MD  Cardiologist:  Mare Ferrari    Chief Complaint  Patient presents with  . Atrial Fibrillation    5 month check - seen for Dr. Mare Ferrari    History of Present Illness: Autumn Moss is a 74 y.o. female who presents today for a 5 month check. Seen for Dr. Mare Ferrari. She has a history of PAF, palpitations, bradycardia, HLD, GERD and prior TIA. She has carotid disease. She is on Xarelto for her anticoagulation and Flecainide for AAD.   She does not have any known history of ischemic heart disease.  She did have an echocardiogram in 11/20/11 which showed normal left ventricular ejection fraction of 55-60% and trivial aortic insufficiency.   Last seen here back in May - felt to be doing well.  Comes in today. Here alone. Doing ok. Needs carotids updated. She says she is doing ok. No chest pain. Breathing is good. Not dizzy or lightheaded. Walking most days. Weight is up some. Using too much salt. She has not been monitoring her BP for a while - it is up today. No Af noted.    Past Medical History  Diagnosis Date  . Hypertension   . Paroxysmal atrial fibrillation   . Palpitations     sporadic  . Hyperlipidemia   . Aortic valve disease     mild - 07/05/05 per echocardiogram   . Left ventricular diastolic dysfunction     mild - 07/05/05 per echocardiogram  . COPD (chronic obstructive pulmonary disease)   . Ectopic pregnancy     history of x2  . Dyspepsia     occasional  . History of echocardiogram 01/26/2009    Est. EF of 55-60%  --  MILD MITRAL REGURGITATION  -  SINCE LAST ECHO OF 07/10/05 NO SIGNIFICANT CHANGES.  ---  Joyice Faster Brackbill, MD  . Sinusitis acute 04/10/2011  . 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 Chronic anticoagulation     short course of Xarelto  . TIA (transient ischemic attack) 01/2012    back on Xarelto  . Bradycardia,  sinus   . Cataract 06/28/2013    b/l  . Macular degeneration 06/28/2013  . Medicare annual wellness visit, subsequent 12/27/2013  . Benign essential HTN 04/26/2014  . Low back pain 04/26/2014    Past Surgical History  Procedure Laterality Date  . Ectopic pregnancy surgery      bilateral, both tubes excised and 1 ovary excised  . Oophorectomy    . Appendectomy       Medications: Current Outpatient Prescriptions  Medication Sig Dispense Refill  . Cholecalciferol (VITAMIN D-3) 1000 UNITS CAPS Take by mouth daily.    . flecainide (TAMBOCOR) 50 MG tablet 1 tablet in the morning and 2 tablets in the evening 270 tablet 3  . Lutein 20 MG CAPS Take 1 capsule by mouth daily.    . metoprolol succinate (TOPROL-XL) 25 MG 24 hr tablet Take 0.5 tablets (12.5 mg total) by mouth 2 (two) times daily. 90 tablet 3  . multivitamin (THERAGRAN) per tablet Take 1 tablet by mouth daily.      Marland Kitchen omeprazole (PRILOSEC) 20 MG capsule Take 1 capsule (20 mg total) by mouth 2 (two) times daily. 180 capsule 3  . Probiotic Product (PROBIOTIC DAILY PO) Take by mouth daily.    . rivaroxaban (XARELTO) 20 MG TABS tablet TAKE  1 TABLET BY MOUTH DAILY WITH SUPPER 30 tablet 11  . simvastatin (ZOCOR) 20 MG tablet Take 1 tablet (20 mg total) by mouth daily. 90 tablet 3   No current facility-administered medications for this visit.    Allergies: No Known Allergies  Social History: The patient  reports that she quit smoking about 9 years ago. Her smoking use included Cigarettes. She has a 12.5 pack-year smoking history. She has never used smokeless tobacco. She reports that she does not drink alcohol or use illicit drugs.   Family History: The patient's family history includes Arthritis in her brother and mother; Atrial fibrillation in her brother; Breast cancer in her sister; Cancer in her father; Hypertension in her brother and mother; Kidney failure in her mother; Sleep apnea in her brother; Stroke in her mother. There is  no history of Heart attack.   Review of Systems: Please see the history of present illness.   Otherwise, the review of systems is positive for none.   All other systems are reviewed and negative.   Physical Exam: VS:  BP 190/90 mmHg  Pulse 52  Ht 5\' 5"  (1.651 m)  Wt 188 lb 6.4 oz (85.458 kg)  BMI 31.35 kg/m2 .  BMI Body mass index is 31.35 kg/(m^2).  Wt Readings from Last 3 Encounters:  02/01/15 188 lb 6.4 oz (85.458 kg)  08/24/14 186 lb 12.8 oz (84.732 kg)  07/06/14 189 lb 2 oz (85.787 kg)    General: Pleasant. Well developed, well nourished and in no acute distress.  HEENT: Normal. Neck: Supple, no JVD, carotid bruits, or masses noted.  Cardiac: Regular rate and rhythm. No murmurs, rubs, or gallops. No edema.  Respiratory:  Lungs are clear to auscultation bilaterally with normal work of breathing.  GI: Soft and nontender.  MS: No deformity or atrophy. Gait and ROM intact. Skin: Warm and dry. Color is normal.  Neuro:  Strength and sensation are intact and no gross focal deficits noted.  Psych: Alert, appropriate and with normal affect.   LABORATORY DATA:  EKG:  EKG is ordered today. This demonstrates sinus brady - rate of 52 with nonspecific ST changes.  Lab Results  Component Value Date   WBC 6.0 06/25/2013   HGB 12.5 06/25/2013   HCT 37.4 06/25/2013   PLT 270 06/25/2013   GLUCOSE 83 08/24/2014   CHOL 145 08/24/2014   TRIG 51.0 08/24/2014   HDL 69.10 08/24/2014   LDLDIRECT 156.6 10/17/2010   LDLCALC 66 08/24/2014   ALT 16 08/24/2014   AST 22 08/24/2014   NA 139 08/24/2014   K 4.1 08/24/2014   CL 105 08/24/2014   CREATININE 1.00 08/24/2014   BUN 15 08/24/2014   CO2 30 08/24/2014   TSH 2.62 07/27/2013    BNP (last 3 results) No results for input(s): BNP in the last 8760 hours.  ProBNP (last 3 results) No results for input(s): PROBNP in the last 8760 hours.   Other Studies Reviewed Today:  Echo Study Conclusions from 11/2011  - Left ventricle: The  cavity size was normal. Wall thickness was normal. Systolic function was normal. The estimated ejection fraction was in the range of 55% to 60%. - Aortic valve: Trivial regurgitation. - Atrial septum: No defect or patent foramen ovale was identified. - Pulmonary arteries: PA peak pressure: 75mm Hg (S).  Assessment/Plan: 1. PAF - remains in NSR on her current regimen.   2. Chronic anticoagulation - no problems noted - will get CBC today.   3. HTN -  BP is up - repeat by me is 180/80. She is using too much salt. Wants to monitor some readings over the next week - will then call Rip Harbour and give her an update.  4. HLD - fasting today - will get her labs.   5. Carotid disease - needs doppler study in October.   She has been informed of Dr. Sherryl Barters retirement for March.   Current medicines are reviewed with the patient today.  The patient does not have concerns regarding medicines other than what has been noted above.  The following changes have been made:  See above.  Labs/ tests ordered today include:    Orders Placed This Encounter  Procedures  . Basic metabolic panel  . CBC  . Hepatic function panel  . Lipid panel  . TSH  . EKG 12-Lead     Disposition:   FU with Dr. Mare Ferrari in 4  months.   Patient is agreeable to this plan and will call if any problems develop in the interim.   Signed: Burtis Junes, RN, ANP-C 02/01/2015 1:58 PM  [facility] [address] [place], [place]  [address] Phone: [phone] Fax: [phone]

## 2015-02-10 ENCOUNTER — Other Ambulatory Visit: Payer: Self-pay | Admitting: Nurse Practitioner

## 2015-02-10 ENCOUNTER — Telehealth: Payer: Self-pay | Admitting: Cardiology

## 2015-02-10 DIAGNOSIS — I6523 Occlusion and stenosis of bilateral carotid arteries: Secondary | ICD-10-CM

## 2015-02-10 DIAGNOSIS — Z7901 Long term (current) use of anticoagulants: Secondary | ICD-10-CM

## 2015-02-10 DIAGNOSIS — I491 Atrial premature depolarization: Secondary | ICD-10-CM

## 2015-02-10 DIAGNOSIS — E785 Hyperlipidemia, unspecified: Secondary | ICD-10-CM

## 2015-02-10 DIAGNOSIS — I480 Paroxysmal atrial fibrillation: Secondary | ICD-10-CM

## 2015-02-10 NOTE — Telephone Encounter (Signed)
Discussed with  Dr. Mare Ferrari and will have patient continue same dose of medications  Call back if any problems Patient aware

## 2015-02-10 NOTE — Telephone Encounter (Signed)
New Message       Pt calling back to give Melinda BP readings. Please call back.

## 2015-02-10 NOTE — Telephone Encounter (Signed)
Spoke with patient and blood pressures are as follows   02/04/15   125/60 P 46   02/07/15   109/58 P 48   02/08/15   107/54 P 43   02/09/15   125/64 P 42   02/10/15   122/58 P 44  Taking Toprol 25 mg 1/2 tablet twice a day and Flecainide 50 mg 1 in the morning and 2 in the evening  Patient states she feels great and does not want to make any changes Will forward to  Dr. Mare Ferrari for review

## 2015-02-14 ENCOUNTER — Ambulatory Visit (INDEPENDENT_AMBULATORY_CARE_PROVIDER_SITE_OTHER): Payer: Medicare Other | Admitting: Family Medicine

## 2015-02-14 ENCOUNTER — Encounter: Payer: Self-pay | Admitting: Family Medicine

## 2015-02-14 VITALS — BP 112/76 | HR 55 | Temp 98.2°F | Ht 65.0 in | Wt 186.1 lb

## 2015-02-14 DIAGNOSIS — I739 Peripheral vascular disease, unspecified: Secondary | ICD-10-CM

## 2015-02-14 DIAGNOSIS — E663 Overweight: Secondary | ICD-10-CM | POA: Diagnosis not present

## 2015-02-14 DIAGNOSIS — K625 Hemorrhage of anus and rectum: Secondary | ICD-10-CM

## 2015-02-14 DIAGNOSIS — I779 Disorder of arteries and arterioles, unspecified: Secondary | ICD-10-CM

## 2015-02-14 DIAGNOSIS — E782 Mixed hyperlipidemia: Secondary | ICD-10-CM | POA: Diagnosis not present

## 2015-02-14 DIAGNOSIS — I10 Essential (primary) hypertension: Secondary | ICD-10-CM | POA: Diagnosis not present

## 2015-02-14 DIAGNOSIS — Z0000 Encounter for general adult medical examination without abnormal findings: Secondary | ICD-10-CM | POA: Diagnosis not present

## 2015-02-14 DIAGNOSIS — I480 Paroxysmal atrial fibrillation: Secondary | ICD-10-CM

## 2015-02-14 DIAGNOSIS — D649 Anemia, unspecified: Secondary | ICD-10-CM

## 2015-02-14 HISTORY — DX: Anemia, unspecified: D64.9

## 2015-02-14 NOTE — Assessment & Plan Note (Addendum)
Patient denies any difficulties at home. No trouble with ADLs, depression or falls. No recent changes to vision or hearing. Is UTD with immunizations. Is UTD with screening. Discussed Advanced Directives, patient agrees to bring [place] copies of documents if can. Encouraged heart healthy diet, exercise as tolerated and adequate sleep. Labs reviewed.  Sees Dr Mare Ferrari cardiology Sees Dr Marica Otter opthamology Sees Dr Watt Climes  At [facility], reports last colonoscopy less than 5 years ago and not due yet Last pap 12/18/2012 was normal MGM in April 2016, small spot seen, 6 month follow up mgm later this week Sees [facility], Dr Osie Cheeks  See problem list for risk factors See AVS for recommended health maintenance

## 2015-02-14 NOTE — Patient Instructions (Addendum)
Call insurance and ask if they cover the Zostavax/Shingles shot then we can send the prescription to your pharmacy  Ask insurance if they will cover Prevnar (PCV13), you had the Pneumovax (PCV23) in 2009, is it cheaper to get these at [facility] or pharmacy  Take Vitamin D 1000 to 2000 IU daily Increase leafy greens, consider increased lean red meat and using cast iron cookware. Continue to monitor, report any concerns  Preventive Care for Adults, Female A healthy lifestyle and preventive care can promote health and wellness. Preventive health guidelines for women include the following key practices.  A routine yearly physical is a good way to check with your health care provider about your health and preventive screening. It is a chance to share any concerns and updates on your health and to receive a thorough exam.  Visit your dentist for a routine exam and preventive care every 6 months. Brush your teeth twice a day and floss once a day. Good oral hygiene prevents tooth decay and gum disease.  The frequency of eye exams is based on your age, health, family medical history, use of contact lenses, and other factors. Follow your health care provider's recommendations for frequency of eye exams.  Eat a healthy diet. Foods like vegetables, fruits, whole grains, low-fat dairy products, and lean protein foods contain the nutrients you need without too many calories. Decrease your intake of foods high in solid fats, added sugars, and salt. Eat the right amount of calories for you.Get information about a proper diet from your health care provider, if necessary.  Regular physical exercise is one of the most important things you can do for your health. Most adults should get at least 150 minutes of moderate-intensity exercise (any activity that increases your heart rate and causes you to sweat) each week. In addition, most adults need muscle-strengthening exercises on 2 or more days a  week.  Maintain a healthy weight. The body mass index (BMI) is a screening tool to identify possible weight problems. It provides an estimate of body fat based on height and weight. Your health care provider can find your BMI and can help you achieve or maintain a healthy weight.For adults 20 years and older:  A BMI below 18.5 is considered underweight.  A BMI of 18.5 to 24.9 is normal.  A BMI of 25 to 29.9 is considered overweight.  A BMI of 30 and above is considered obese.  Maintain normal blood lipids and cholesterol levels by exercising and minimizing your intake of saturated fat. Eat a balanced diet with plenty of fruit and vegetables. Blood tests for lipids and cholesterol should begin at age 78 and be repeated every 5 years. If your lipid or cholesterol levels are high, you are over 50, or you are at high risk for heart disease, you may need your cholesterol levels checked more frequently.Ongoing high lipid and cholesterol levels should be treated with medicines if diet and exercise are not working.  If you smoke, find out from your health care provider how to quit. If you do not use tobacco, do not start.  Lung cancer screening is recommended for adults aged 50-80 years who are at high risk for developing lung cancer because of a history of smoking. A yearly low-dose CT scan of the lungs is recommended for people who have at least a 30-pack-year history of smoking and are a current smoker or have quit within the past 15 years. A pack year of smoking is smoking an average  of 1 pack of cigarettes a day for 1 year (for example: 1 pack a day for 30 years or 2 packs a day for 15 years). Yearly screening should continue until the smoker has stopped smoking for at least 15 years. Yearly screening should be stopped for people who develop a health problem that would prevent them from having lung cancer treatment.  If you are pregnant, do not drink alcohol. If you are breastfeeding, be very  cautious about drinking alcohol. If you are not pregnant and choose to drink alcohol, do not have more than 1 drink per day. One drink is considered to be 12 ounces (355 mL) of beer, 5 ounces (148 mL) of wine, or 1.5 ounces (44 mL) of liquor.  Avoid use of street drugs. Do not share needles with anyone. Ask for help if you need support or instructions about stopping the use of drugs.  High blood pressure causes heart disease and increases the risk of stroke. Your blood pressure should be checked at least every 1 to 2 years. Ongoing high blood pressure should be treated with medicines if weight loss and exercise do not work.  If you are 45-64 years old, ask your health care provider if you should take aspirin to prevent strokes.  Diabetes screening is done by taking a blood sample to check your blood glucose level after you have not eaten for a certain period of time (fasting). If you are not overweight and you do not have risk factors for diabetes, you should be screened once every 3 years starting at age 97. If you are overweight or obese and you are 89-103 years of age, you should be screened for diabetes every year as part of your cardiovascular risk assessment.  Breast cancer screening is essential preventive care for women. You should practice "breast self-awareness." This means understanding the normal appearance and feel of your breasts and may include breast self-examination. Any changes detected, no matter how small, should be reported to a health care provider. Women in their 47s and 30s should have a clinical breast exam (CBE) by a health care provider as part of a regular health exam every 1 to 3 years. After age 53, women should have a CBE every year. Starting at age 17, women should consider having a mammogram (breast X-ray test) every year. Women who have a family history of breast cancer should talk to their health care provider about genetic screening. Women at a high risk of breast cancer  should talk to their health care providers about having an MRI and a mammogram every year.  Breast cancer gene (BRCA)-related cancer risk assessment is recommended for women who have family members with BRCA-related cancers. BRCA-related cancers include breast, ovarian, tubal, and peritoneal cancers. Having family members with these cancers may be associated with an increased risk for harmful changes (mutations) in the breast cancer genes BRCA1 and BRCA2. Results of the assessment will determine the need for genetic counseling and BRCA1 and BRCA2 testing.  Your health care provider may recommend that you be screened regularly for cancer of the pelvic organs (ovaries, uterus, and vagina). This screening involves a pelvic examination, including checking for microscopic changes to the surface of your cervix (Pap test). You may be encouraged to have this screening done every 3 years, beginning at age 54.  For women ages 70-65, health care providers may recommend pelvic exams and Pap testing every 3 years, or they may recommend the Pap and pelvic exam, combined with testing  for human papilloma virus (HPV), every 5 years. Some types of HPV increase your risk of cervical cancer. Testing for HPV may also be done on women of any age with unclear Pap test results.  Other health care providers may not recommend any screening for nonpregnant women who are considered low risk for pelvic cancer and who do not have symptoms. Ask your health care provider if a screening pelvic exam is right for you.  If you have had past treatment for cervical cancer or a condition that could lead to cancer, you need Pap tests and screening for cancer for at least 20 years after your treatment. If Pap tests have been discontinued, your risk factors (such as having a new sexual partner) need to be reassessed to determine if screening should resume. Some women have medical problems that increase the chance of getting cervical cancer. In  these cases, your health care provider may recommend more frequent screening and Pap tests.  Colorectal cancer can be detected and often prevented. Most routine colorectal cancer screening begins at the age of 37 years and continues through age 65 years. However, your health care provider may recommend screening at an earlier age if you have risk factors for colon cancer. On a yearly basis, your health care provider may provide home test kits to check for hidden blood in the stool. Use of a small camera at the end of a tube, to directly examine the colon (sigmoidoscopy or colonoscopy), can detect the earliest forms of colorectal cancer. Talk to your health care provider about this at age 20, when routine screening begins. Direct exam of the colon should be repeated every 5-10 years through age 64 years, unless early forms of precancerous polyps or small growths are found.  People who are at an increased risk for hepatitis B should be screened for this virus. You are considered at high risk for hepatitis B if:  You were born in a country where hepatitis B occurs often. Talk with your health care provider about which countries are considered high risk.  Your parents were born in a high-risk country and you have not received a shot to protect against hepatitis B (hepatitis B vaccine).  You have HIV or AIDS.  You use needles to inject street drugs.  You live with, or have sex with, someone who has hepatitis B.  You get hemodialysis treatment.  You take certain medicines for conditions like cancer, organ transplantation, and autoimmune conditions.  Hepatitis C blood testing is recommended for all people born from [birth year] through 1965 and any individual with known risks for hepatitis C.  Practice safe sex. Use condoms and avoid high-risk sexual practices to reduce the spread of sexually transmitted infections (STIs). STIs include gonorrhea, chlamydia, syphilis, trichomonas, herpes, HPV, and human  immunodeficiency virus (HIV). Herpes, HIV, and HPV are viral illnesses that have no cure. They can result in disability, cancer, and death.  You should be screened for sexually transmitted illnesses (STIs) including gonorrhea and chlamydia if:  You are sexually active and are younger than 24 years.  You are older than 24 years and your health care provider tells you that you are at risk for this type of infection.  Your sexual activity has changed since you were last screened and you are at an increased risk for chlamydia or gonorrhea. Ask your health care provider if you are at risk.  If you are at risk of being infected with HIV, it is recommended that you take a  prescription medicine daily to prevent HIV infection. This is called preexposure prophylaxis (PrEP). You are considered at risk if:  You are sexually active and do not regularly use condoms or know the HIV status of your partner(s).  You take drugs by injection.  You are sexually active with a partner who has HIV.  Talk with your health care provider about whether you are at high risk of being infected with HIV. If you choose to begin PrEP, you should first be tested for HIV. You should then be tested every 3 months for as long as you are taking PrEP.  Osteoporosis is a disease in which the bones lose minerals and strength with aging. This can result in serious bone fractures or breaks. The risk of osteoporosis can be identified using a bone density scan. Women ages 45 years and over and women at risk for fractures or osteoporosis should discuss screening with their health care providers. Ask your health care provider whether you should take a calcium supplement or vitamin D to reduce the rate of osteoporosis.  Menopause can be associated with physical symptoms and risks. Hormone replacement therapy is available to decrease symptoms and risks. You should talk to your health care provider about whether hormone replacement therapy is  right for you.  Use sunscreen. Apply sunscreen liberally and repeatedly throughout the day. You should seek shade when your shadow is shorter than you. Protect yourself by wearing long sleeves, pants, a wide-brimmed hat, and sunglasses year round, whenever you are outdoors.  Once a month, do a whole body skin exam, using a mirror to look at the skin on your back. Tell your health care provider of new moles, moles that have irregular borders, moles that are larger than a pencil eraser, or moles that have changed in shape or color.  Stay current with required vaccines (immunizations).  Influenza vaccine. All adults should be immunized every year.  Tetanus, diphtheria, and acellular pertussis (Td, Tdap) vaccine. Pregnant women should receive 1 dose of Tdap vaccine during each pregnancy. The dose should be obtained regardless of the length of time since the last dose. Immunization is preferred during the 27th-36th week of gestation. An adult who has not previously received Tdap or who does not know her vaccine status should receive 1 dose of Tdap. This initial dose should be followed by tetanus and diphtheria toxoids (Td) booster doses every 10 years. Adults with an unknown or incomplete history of completing a 3-dose immunization series with Td-containing vaccines should begin or complete a primary immunization series including a Tdap dose. Adults should receive a Td booster every 10 years.  Varicella vaccine. An adult without evidence of immunity to varicella should receive 2 doses or a second dose if she has previously received 1 dose. Pregnant females who do not have evidence of immunity should receive the first dose after pregnancy. This first dose should be obtained before leaving the health care facility. The second dose should be obtained 4-8 weeks after the first dose.  Human papillomavirus (HPV) vaccine. Females aged 13-26 years who have not received the vaccine previously should obtain the  3-dose series. The vaccine is not recommended for use in pregnant females. However, pregnancy testing is not needed before receiving a dose. If a female is found to be pregnant after receiving a dose, no treatment is needed. In that case, the remaining doses should be delayed until after the pregnancy. Immunization is recommended for any person with an immunocompromised condition through the age of  26 years if she did not get any or all doses earlier. During the 3-dose series, the second dose should be obtained 4-8 weeks after the first dose. The third dose should be obtained 24 weeks after the first dose and 16 weeks after the second dose.  Zoster vaccine. One dose is recommended for adults aged 31 years or older unless certain conditions are present.  Measles, mumps, and rubella (MMR) vaccine. Adults born before [birth year] generally are considered immune to measles and mumps. Adults born in [birth year] or later should have 1 or more doses of MMR vaccine unless there is a contraindication to the vaccine or there is laboratory evidence of immunity to each of the three diseases. A routine second dose of MMR vaccine should be obtained at least 28 days after the first dose for students attending postsecondary schools, health care workers, or international travelers. People who received inactivated measles vaccine or an unknown type of measles vaccine during 1963-1967 should receive 2 doses of MMR vaccine. People who received inactivated mumps vaccine or an unknown type of mumps vaccine before 1979 and are at high risk for mumps infection should consider immunization with 2 doses of MMR vaccine. For females of childbearing age, rubella immunity should be determined. If there is no evidence of immunity, females who are not pregnant should be vaccinated. If there is no evidence of immunity, females who are pregnant should delay immunization until after pregnancy. Unvaccinated health care workers born before [birth year] who lack  laboratory evidence of measles, mumps, or rubella immunity or laboratory confirmation of disease should consider measles and mumps immunization with 2 doses of MMR vaccine or rubella immunization with 1 dose of MMR vaccine.  Pneumococcal 13-valent conjugate (PCV13) vaccine. When indicated, a person who is uncertain of his immunization history and has no record of immunization should receive the PCV13 vaccine. All adults 38 years of age and older should receive this vaccine. An adult aged 67 years or older who has certain medical conditions and has not been previously immunized should receive 1 dose of PCV13 vaccine. This PCV13 should be followed with a dose of pneumococcal polysaccharide (PPSV23) vaccine. Adults who are at high risk for pneumococcal disease should obtain the PPSV23 vaccine at least 8 weeks after the dose of PCV13 vaccine. Adults older than 76 years of age who have normal immune system function should obtain the PPSV23 vaccine dose at least 1 year after the dose of PCV13 vaccine.  Pneumococcal polysaccharide (PPSV23) vaccine. When PCV13 is also indicated, PCV13 should be obtained first. All adults aged 59 years and older should be immunized. An adult younger than age 66 years who has certain medical conditions should be immunized. Any person who resides in a nursing home or long-term care facility should be immunized. An adult smoker should be immunized. People with an immunocompromised condition and certain other conditions should receive both PCV13 and PPSV23 vaccines. People with human immunodeficiency virus (HIV) infection should be immunized as soon as possible after diagnosis. Immunization during chemotherapy or radiation therapy should be avoided. Routine use of PPSV23 vaccine is not recommended for American Indians, [place] Natives, or people younger than 65 years unless there are medical conditions that require PPSV23 vaccine. When indicated, people who have unknown immunization and have  no record of immunization should receive PPSV23 vaccine. One-time revaccination 5 years after the first dose of PPSV23 is recommended for people aged 19-64 years who have chronic kidney failure, nephrotic syndrome, asplenia, or immunocompromised conditions. People who received  1-2 doses of PPSV23 before age 53 years should receive another dose of PPSV23 vaccine at age 77 years or later if at least 5 years have passed since the previous dose. Doses of PPSV23 are not needed for people immunized with PPSV23 at or after age 109 years.  Meningococcal vaccine. Adults with asplenia or persistent complement component deficiencies should receive 2 doses of quadrivalent meningococcal conjugate (MenACWY-D) vaccine. The doses should be obtained at least 2 months apart. Microbiologists working with certain meningococcal bacteria, [place] recruits, people at risk during an outbreak, and people who travel to or live in countries with a high rate of meningitis should be immunized. A first-year college student up through age 8 years who is living in a residence hall should receive a dose if she did not receive a dose on or after her 16th birthday. Adults who have certain high-risk conditions should receive one or more doses of vaccine.  Hepatitis A vaccine. Adults who wish to be protected from this disease, have certain high-risk conditions, work with hepatitis A-infected animals, work in hepatitis A research labs, or travel to or work in countries with a high rate of hepatitis A should be immunized. Adults who were previously unvaccinated and who anticipate close contact with an international adoptee during the first 60 days after arrival in the [place] from a country with a high rate of hepatitis A should be immunized.  Hepatitis B vaccine. Adults who wish to be protected from this disease, have certain high-risk conditions, may be exposed to blood or other infectious body fluids, are household contacts or sex  partners of hepatitis B positive people, are clients or workers in certain care facilities, or travel to or work in countries with a high rate of hepatitis B should be immunized.  Haemophilus influenzae type b (Hib) vaccine. A previously unvaccinated person with asplenia or sickle cell disease or having a scheduled splenectomy should receive 1 dose of Hib vaccine. Regardless of previous immunization, a recipient of a hematopoietic stem cell transplant should receive a 3-dose series 6-12 months after her successful transplant. Hib vaccine is not recommended for adults with HIV infection. Preventive Services / Frequency Ages 15 to 44 years  Blood pressure check.** / Every 3-5 years.  Lipid and cholesterol check.** / Every 5 years beginning at age 32.  Clinical breast exam.** / Every 3 years for women in their 65s and 46s.  BRCA-related cancer risk assessment.** / For women who have family members with a BRCA-related cancer (breast, ovarian, tubal, or peritoneal cancers).  Pap test.** / Every 2 years from ages 84 through 30. Every 3 years starting at age 73 through age 49 or 54 with a history of 3 consecutive normal Pap tests.  HPV screening.** / Every 3 years from ages 44 through ages 54 to 18 with a history of 3 consecutive normal Pap tests.  Hepatitis C blood test.** / For any individual with known risks for hepatitis C.  Skin self-exam. / Monthly.  Influenza vaccine. / Every year.  Tetanus, diphtheria, and acellular pertussis (Tdap, Td) vaccine.** / Consult your health care provider. Pregnant women should receive 1 dose of Tdap vaccine during each pregnancy. 1 dose of Td every 10 years.  Varicella vaccine.** / Consult your health care provider. Pregnant females who do not have evidence of immunity should receive the first dose after pregnancy.  HPV vaccine. / 3 doses over 6 months, if 33 and younger. The vaccine is not recommended for use in pregnant females.  However, pregnancy testing  is not needed before receiving a dose.  Measles, mumps, rubella (MMR) vaccine.** / You need at least 1 dose of MMR if you were born in 1957 or later. You may also need a 2nd dose. For females of childbearing age, rubella immunity should be determined. If there is no evidence of immunity, females who are not pregnant should be vaccinated. If there is no evidence of immunity, females who are pregnant should delay immunization until after pregnancy.  Pneumococcal 13-valent conjugate (PCV13) vaccine.** / Consult your health care provider.  Pneumococcal polysaccharide (PPSV23) vaccine.** / 1 to 2 doses if you smoke cigarettes or if you have certain conditions.  Meningococcal vaccine.** / 1 dose if you are age 62 to 20 years and a Market researcher living in a residence hall, or have one of several medical conditions, you need to get vaccinated against meningococcal disease. You may also need additional booster doses.  Hepatitis A vaccine.** / Consult your health care provider.  Hepatitis B vaccine.** / Consult your health care provider.  Haemophilus influenzae type b (Hib) vaccine.** / Consult your health care provider. Ages 54 to 68 years  Blood pressure check.** / Every year.  Lipid and cholesterol check.** / Every 5 years beginning at age 14 years.  Lung cancer screening. / Every year if you are aged 76-80 years and have a 30-pack-year history of smoking and currently smoke or have quit within the past 15 years. Yearly screening is stopped once you have quit smoking for at least 15 years or develop a health problem that would prevent you from having lung cancer treatment.  Clinical breast exam.** / Every year after age 60 years.  BRCA-related cancer risk assessment.** / For women who have family members with a BRCA-related cancer (breast, ovarian, tubal, or peritoneal cancers).  Mammogram.** / Every year beginning at age 59 years and continuing for as long as you are in good  health. Consult with your health care provider.  Pap test.** / Every 3 years starting at age 51 years through age 53 or 31 years with a history of 3 consecutive normal Pap tests.  HPV screening.** / Every 3 years from ages 39 years through ages 65 to 80 years with a history of 3 consecutive normal Pap tests.  Fecal occult blood test (FOBT) of stool. / Every year beginning at age 51 years and continuing until age 72 years. You may not need to do this test if you get a colonoscopy every 10 years.  Flexible sigmoidoscopy or colonoscopy.** / Every 5 years for a flexible sigmoidoscopy or every 10 years for a colonoscopy beginning at age 49 years and continuing until age 46 years.  Hepatitis C blood test.** / For all people born from [birth year] through 1965 and any individual with known risks for hepatitis C.  Skin self-exam. / Monthly.  Influenza vaccine. / Every year.  Tetanus, diphtheria, and acellular pertussis (Tdap/Td) vaccine.** / Consult your health care provider. Pregnant women should receive 1 dose of Tdap vaccine during each pregnancy. 1 dose of Td every 10 years.  Varicella vaccine.** / Consult your health care provider. Pregnant females who do not have evidence of immunity should receive the first dose after pregnancy.  Zoster vaccine.** / 1 dose for adults aged 57 years or older.  Measles, mumps, rubella (MMR) vaccine.** / You need at least 1 dose of MMR if you were born in 1957 or later. You may also need a second dose. For females of  childbearing age, rubella immunity should be determined. If there is no evidence of immunity, females who are not pregnant should be vaccinated. If there is no evidence of immunity, females who are pregnant should delay immunization until after pregnancy.  Pneumococcal 13-valent conjugate (PCV13) vaccine.** / Consult your health care provider.  Pneumococcal polysaccharide (PPSV23) vaccine.** / 1 to 2 doses if you smoke cigarettes or if you have certain  conditions.  Meningococcal vaccine.** / Consult your health care provider.  Hepatitis A vaccine.** / Consult your health care provider.  Hepatitis B vaccine.** / Consult your health care provider.  Haemophilus influenzae type b (Hib) vaccine.** / Consult your health care provider. Ages 77 years and over  Blood pressure check.** / Every year.  Lipid and cholesterol check.** / Every 5 years beginning at age 62 years.  Lung cancer screening. / Every year if you are aged 23-80 years and have a 30-pack-year history of smoking and currently smoke or have quit within the past 15 years. Yearly screening is stopped once you have quit smoking for at least 15 years or develop a health problem that would prevent you from having lung cancer treatment.  Clinical breast exam.** / Every year after age 52 years.  BRCA-related cancer risk assessment.** / For women who have family members with a BRCA-related cancer (breast, ovarian, tubal, or peritoneal cancers).  Mammogram.** / Every year beginning at age 26 years and continuing for as long as you are in good health. Consult with your health care provider.  Pap test.** / Every 3 years starting at age 73 years through age 23 or 39 years with 3 consecutive normal Pap tests. Testing can be stopped between 65 and 70 years with 3 consecutive normal Pap tests and no abnormal Pap or HPV tests in the past 10 years.  HPV screening.** / Every 3 years from ages 57 years through ages 67 or 49 years with a history of 3 consecutive normal Pap tests. Testing can be stopped between 65 and 70 years with 3 consecutive normal Pap tests and no abnormal Pap or HPV tests in the past 10 years.  Fecal occult blood test (FOBT) of stool. / Every year beginning at age 53 years and continuing until age 70 years. You may not need to do this test if you get a colonoscopy every 10 years.  Flexible sigmoidoscopy or colonoscopy.** / Every 5 years for a flexible sigmoidoscopy or every 10  years for a colonoscopy beginning at age 54 years and continuing until age 90 years.  Hepatitis C blood test.** / For all people born from [birth year] through 1967 and any individual with known risks for hepatitis C.  Osteoporosis screening.** / A one-time screening for women ages 69 years and over and women at risk for fractures or osteoporosis.  Skin self-exam. / Monthly.  Influenza vaccine. / Every year.  Tetanus, diphtheria, and acellular pertussis (Tdap/Td) vaccine.** / 1 dose of Td every 10 years.  Varicella vaccine.** / Consult your health care provider.  Zoster vaccine.** / 1 dose for adults aged 33 years or older.  Pneumococcal 13-valent conjugate (PCV13) vaccine.** / Consult your health care provider.  Pneumococcal polysaccharide (PPSV23) vaccine.** / 1 dose for all adults aged 98 years and older.  Meningococcal vaccine.** / Consult your health care provider.  Hepatitis A vaccine.** / Consult your health care provider.  Hepatitis B vaccine.** / Consult your health care provider.  Haemophilus influenzae type b (Hib) vaccine.** / Consult your health care provider. ** Family history  and personal history of risk and conditions may change your health care provider's recommendations.   This information is not intended to replace advice given to you by your health care provider. Make sure you discuss any questions you have with your health care provider.   Document Released: 06/19/2001 Document Revised: 05/14/2014 Document Reviewed: 09/18/2010 Elsevier Interactive Patient Education [employer].

## 2015-02-14 NOTE — Progress Notes (Signed)
Subjective:    Patient ID: Autumn Moss, female    DOB: [birth year], 74 y.o.   MRN: [id number]  Chief Complaint  Patient presents with  . Medicare Wellness    HPI Patient is in today for annual wellness exam and follow-up on numerous concerns. Today she feels well. She is tired however she was up late last night after her daughter that she was given the diagnosis of scleroderma recently. She denies any acute illness or acute concerns for herself. She has a repeat mammogram scheduled later this week for a slight abnormality seen 6 months ago on mammogram but she offers no breast complaints. She also has her annual carotid Doppler later this week but also offers no complaints. She's had no recent illness, syncope, lightheadedness or other concerns. Denies CP/palp/SOB/HA/congestion/fevers/GI or GU c/o. Taking meds as prescribed  Past Medical History  Diagnosis Date  . Hypertension   . Paroxysmal atrial fibrillation (HCC)   . Palpitations     sporadic  . Hyperlipidemia   . Aortic valve disease     mild - 07/05/05 per echocardiogram   . Left ventricular diastolic dysfunction     mild - 07/05/05 per echocardiogram  . COPD (chronic obstructive pulmonary disease) ([place])   . Ectopic pregnancy     history of x2  . Dyspepsia     occasional  . History of echocardiogram 01/26/2009    Est. EF of 55-60%  --  MILD MITRAL REGURGITATION  -  SINCE LAST ECHO OF 07/10/05 NO SIGNIFICANT CHANGES.  ---  Joyice Faster Brackbill, MD  . Sinusitis acute 04/10/2011  . 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 Chronic anticoagulation     short course of Xarelto  . TIA (transient ischemic attack) 01/2012    back on Xarelto  . Bradycardia, sinus   . Cataract 06/28/2013    b/l  . Macular degeneration 06/28/2013  . Medicare annual wellness visit, subsequent 12/27/2013  . Benign essential HTN 04/26/2014  . Low back pain 04/26/2014  . Anemia 02/14/2015    Past Surgical History  Procedure Laterality Date  . Ectopic  pregnancy surgery      bilateral, both tubes excised and 1 ovary excised  . Oophorectomy    . Appendectomy      Family History  Problem Relation Age of Onset  . Kidney failure Mother   . Hypertension Mother   . Stroke Mother   . Arthritis Mother     RA  . Cancer Father     bladder cancer  . Atrial fibrillation Brother   . Sleep apnea Brother   . Heart attack Neg Hx   . Hypertension Brother   . Arthritis Brother   . Thyroid disease Brother     thyroid growth  . Breast cancer Sister   . Scleroderma Daughter     Social History   Social History  . Marital Status: Married    Spouse Name: Josph Macho  . Number of Children: 1  . Years of Education: N/A   Occupational History  .     Social History Main Topics  . Smoking status: Former Smoker -- 0.50 packs/day for 25 years    Types: Cigarettes    Quit date: 06/22/2005  . Smokeless tobacco: Never Used  . Alcohol Use: No  . Drug Use: No  . Sexual Activity: Not Currently   Other Topics Concern  . Not on file   Social History Narrative   Uses seat belt   Worked at [employer], Retired  Regular exercise: yes, [facility] and walks   Diet, no restrictions, minimizes dairy    Outpatient Prescriptions Prior to Visit  Medication Sig Dispense Refill  . Cholecalciferol (VITAMIN D-3) 1000 UNITS CAPS Take by mouth daily.    . flecainide (TAMBOCOR) 50 MG tablet 1 tablet in the morning and 2 tablets in the evening 270 tablet 3  . Lutein 20 MG CAPS Take 1 capsule by mouth daily.    . metoprolol succinate (TOPROL-XL) 25 MG 24 hr tablet Take 0.5 tablets (12.5 mg total) by mouth 2 (two) times daily. 90 tablet 3  . multivitamin (THERAGRAN) per tablet Take 1 tablet by mouth daily.      Marland Kitchen omeprazole (PRILOSEC) 20 MG capsule Take 1 capsule (20 mg total) by mouth 2 (two) times daily. 180 capsule 3  . Probiotic Product (PROBIOTIC DAILY PO) Take by mouth daily.    . rivaroxaban (XARELTO) 20 MG TABS tablet TAKE 1 TABLET BY MOUTH DAILY WITH  SUPPER 30 tablet 11  . simvastatin (ZOCOR) 20 MG tablet Take 1 tablet (20 mg total) by mouth daily. 90 tablet 3   No facility-administered medications prior to visit.    No Known Allergies  Review of Systems  Constitutional: Positive for malaise/fatigue. Negative for fever and chills.  HENT: Negative for congestion and hearing loss.   Eyes: Negative for discharge.  Respiratory: Negative for cough, sputum production and shortness of breath.   Cardiovascular: Negative for chest pain, palpitations and leg swelling.  Gastrointestinal: Negative for heartburn, nausea, vomiting, abdominal pain, diarrhea, constipation and blood in stool.  Genitourinary: Negative for dysuria, urgency, frequency and hematuria.  Musculoskeletal: Negative for myalgias, back pain and falls.  Skin: Negative for rash.  Neurological: Negative for dizziness, sensory change, loss of consciousness, weakness and headaches.  Endo/Heme/Allergies: Negative for environmental allergies. Does not bruise/bleed easily.  Psychiatric/Behavioral: Negative for depression and suicidal ideas. The patient is not nervous/anxious and does not have insomnia.        Objective:    Physical Exam  Constitutional: She is oriented to person, place, and time. She appears well-developed and well-nourished. No distress.  HENT:  Head: Normocephalic and atraumatic.  Eyes: Conjunctivae are normal.  Neck: Neck supple. No thyromegaly present.  Cardiovascular: Normal rate and regular rhythm.   Murmur heard. Pulmonary/Chest: Effort normal and breath sounds normal. No respiratory distress.  Abdominal: Soft. Bowel sounds are normal. She exhibits no distension and no mass. There is no tenderness.  Musculoskeletal: She exhibits no edema.  Lymphadenopathy:    She has no cervical adenopathy.  Neurological: She is alert and oriented to person, place, and time.  Skin: Skin is warm and dry.  Psychiatric: She has a normal mood and affect. Her behavior is  normal.    BP 112/76 mmHg  Pulse 55  Temp(Src) 98.2 F (36.8 C) (Oral)  Ht 5\' 5"  (1.651 m)  Wt 186 lb 2 oz (84.426 kg)  BMI 30.97 kg/m2  SpO2 95% Wt Readings from Last 3 Encounters:  02/14/15 186 lb 2 oz (84.426 kg)  02/01/15 188 lb 6.4 oz (85.458 kg)  08/24/14 186 lb 12.8 oz (84.732 kg)     Lab Results  Component Value Date   WBC 6.7 02/01/2015   HGB 11.8* 02/01/2015   HCT 36.2 02/01/2015   PLT 322.0 02/01/2015   GLUCOSE 87 02/01/2015   CHOL 164 02/01/2015   TRIG 55.0 02/01/2015   HDL 70.40 02/01/2015   LDLDIRECT 156.6 10/17/2010   LDLCALC 83 02/01/2015  ALT 14 02/01/2015   AST 23 02/01/2015   NA 140 02/01/2015   K 4.1 02/01/2015   CL 103 02/01/2015   CREATININE 0.88 02/01/2015   BUN 14 02/01/2015   CO2 31 02/01/2015   TSH 3.08 02/01/2015    Lab Results  Component Value Date   TSH 3.08 02/01/2015   Lab Results  Component Value Date   WBC 6.7 02/01/2015   HGB 11.8* 02/01/2015   HCT 36.2 02/01/2015   MCV 88.6 02/01/2015   PLT 322.0 02/01/2015   Lab Results  Component Value Date   NA 140 02/01/2015   K 4.1 02/01/2015   CO2 31 02/01/2015   GLUCOSE 87 02/01/2015   BUN 14 02/01/2015   CREATININE 0.88 02/01/2015   BILITOT 0.5 02/01/2015   ALKPHOS 95 02/01/2015   AST 23 02/01/2015   ALT 14 02/01/2015   PROT 7.3 02/01/2015   ALBUMIN 3.9 02/01/2015   CALCIUM 9.5 02/01/2015   GFR 66.29 02/01/2015   Lab Results  Component Value Date   CHOL 164 02/01/2015   Lab Results  Component Value Date   HDL 70.40 02/01/2015   Lab Results  Component Value Date   LDLCALC 83 02/01/2015   Lab Results  Component Value Date   TRIG 55.0 02/01/2015   Lab Results  Component Value Date   CHOLHDL 2 02/01/2015   No results found for: HGBA1C     Assessment & Plan:  Mixed hyperlipidemia Tolerating statin, encouraged heart healthy diet, avoid trans fats, minimize simple carbs and saturated fats. Increase exercise as tolerated  Overweight Encouraged DASH  diet, decrease po intake and increase exercise as tolerated. Needs 7-8 hours of sleep nightly. Avoid trans fats, eat small, frequent meals every 4-5 hours with lean proteins, complex carbs and healthy fats. Minimize simple carbs, GMO foods.  Benign essential HTN Well controlled, no changes to meds. Encouraged heart healthy diet such as the DASH diet and exercise as tolerated.   Atrial fibrillation Tolerating Xarelto and Flecainide. Follows every every 4 month  Medicare annual wellness visit, subsequent Patient denies any difficulties at home. No trouble with ADLs, depression or falls. No recent changes to vision or hearing. Is UTD with immunizations. Is UTD with screening. Discussed Advanced Directives, patient agrees to bring [place] copies of documents if can. Encouraged heart healthy diet, exercise as tolerated and adequate sleep. Labs reviewed.  Sees Dr Mare Ferrari cardiology Sees Dr Marica Otter opthamology Sees Dr Watt Climes  At [facility], reports last colonoscopy less than 5 years ago and not due yet Last pap 12/18/2012 was normal MGM in April 2016, small spot seen, 6 month follow up mgm later this week Sees [facility], Dr Osie Cheeks  See problem list for risk factors See AVS for recommended health maintenance  Carotid artery disease Has a repeat ultrasound later this week, asymptomatic  Anemia Mild, Increase leafy greens, consider increased lean red meat and using cast iron cookware. Continue to monitor, report any concerns. Check IFOB, sees [facility], Dr Tami Lin if need be. Reports colonoscopy is on a 5 year recall schedule and she has not been recalled yet    I am having Ms. Soller maintain her multivitamin, Lutein, Vitamin D-3, Probiotic Product (PROBIOTIC DAILY PO), metoprolol succinate, flecainide, omeprazole, simvastatin, and rivaroxaban.  No orders of the defined types were placed in this encounter.   Encouraged to check with insurance regarding payment for Prevnar  and Zostavax  Penni Homans, MD

## 2015-02-14 NOTE — Assessment & Plan Note (Signed)
Tolerating statin, encouraged heart healthy diet, avoid trans fats, minimize simple carbs and saturated fats. Increase exercise as tolerated 

## 2015-02-14 NOTE — Progress Notes (Signed)
Pre visit review using our clinic review tool, if applicable. No additional management support is needed unless otherwise documented below in the visit note. 

## 2015-02-14 NOTE — Assessment & Plan Note (Signed)
Well controlled, no changes to meds. Encouraged heart healthy diet such as the DASH diet and exercise as tolerated.  °

## 2015-02-14 NOTE — Assessment & Plan Note (Signed)
Encouraged DASH diet, decrease po intake and increase exercise as tolerated. Needs 7-8 hours of sleep nightly. Avoid trans fats, eat small, frequent meals every 4-5 hours with lean proteins, complex carbs and healthy fats. Minimize simple carbs, GMO foods. 

## 2015-02-14 NOTE — Assessment & Plan Note (Signed)
Tolerating Xarelto and Flecainide. Follows every every 4 month

## 2015-02-14 NOTE — Assessment & Plan Note (Signed)
Mild, Increase leafy greens, consider increased lean red meat and using cast iron cookware. Continue to monitor, report any concerns. Check IFOB, sees [facility], Dr Tami Lin if need be. Reports colonoscopy is on a 5 year recall schedule and she has not been recalled yet

## 2015-02-14 NOTE — Assessment & Plan Note (Signed)
Has a repeat ultrasound later this week, asymptomatic

## 2015-02-16 ENCOUNTER — Ambulatory Visit (HOSPITAL_COMMUNITY)
Admission: RE | Admit: 2015-02-16 | Discharge: 2015-02-16 | Disposition: A | Discharge status: Home / Self Care | Payer: Medicare Other | Source: Ambulatory Visit | Attending: Urology | Admitting: Urology

## 2015-02-16 DIAGNOSIS — E785 Hyperlipidemia, unspecified: Secondary | ICD-10-CM | POA: Insufficient documentation

## 2015-02-16 DIAGNOSIS — I10 Essential (primary) hypertension: Secondary | ICD-10-CM | POA: Insufficient documentation

## 2015-02-16 DIAGNOSIS — I480 Paroxysmal atrial fibrillation: Secondary | ICD-10-CM | POA: Diagnosis not present

## 2015-02-16 DIAGNOSIS — N63 Unspecified lump in breast: Secondary | ICD-10-CM | POA: Diagnosis not present

## 2015-02-16 DIAGNOSIS — I491 Atrial premature depolarization: Secondary | ICD-10-CM | POA: Diagnosis not present

## 2015-02-16 DIAGNOSIS — Z7901 Long term (current) use of anticoagulants: Secondary | ICD-10-CM | POA: Insufficient documentation

## 2015-02-16 DIAGNOSIS — I6523 Occlusion and stenosis of bilateral carotid arteries: Secondary | ICD-10-CM | POA: Insufficient documentation

## 2015-02-16 DIAGNOSIS — Z87891 Personal history of nicotine dependence: Secondary | ICD-10-CM | POA: Diagnosis not present

## 2015-02-16 DIAGNOSIS — Z803 Family history of malignant neoplasm of breast: Secondary | ICD-10-CM | POA: Diagnosis not present

## 2015-02-16 LAB — HM MAMMOGRAPHY

## 2015-02-21 ENCOUNTER — Encounter: Payer: Self-pay | Admitting: Family Medicine

## 2015-04-19 ENCOUNTER — Telehealth: Payer: Self-pay | Admitting: Cardiology

## 2015-04-19 DIAGNOSIS — I119 Hypertensive heart disease without heart failure: Secondary | ICD-10-CM

## 2015-04-19 NOTE — Telephone Encounter (Signed)
Follow up  Pt has appt sch for 01/05 @ 3:15p with Dr. Mare Ferrari. Please place orders for labs

## 2015-04-22 NOTE — Telephone Encounter (Signed)
Orders placed for January appointment

## 2015-05-12 ENCOUNTER — Encounter: Payer: Self-pay | Admitting: Cardiology

## 2015-05-12 ENCOUNTER — Other Ambulatory Visit (INDEPENDENT_AMBULATORY_CARE_PROVIDER_SITE_OTHER): Payer: Medicare Other | Admitting: *Deleted

## 2015-05-12 ENCOUNTER — Ambulatory Visit (INDEPENDENT_AMBULATORY_CARE_PROVIDER_SITE_OTHER): Payer: Medicare Other | Admitting: Cardiology

## 2015-05-12 VITALS — BP 150/76 | HR 55 | Ht 65.0 in | Wt 189.8 lb

## 2015-05-12 DIAGNOSIS — I480 Paroxysmal atrial fibrillation: Secondary | ICD-10-CM | POA: Diagnosis not present

## 2015-05-12 DIAGNOSIS — Z7901 Long term (current) use of anticoagulants: Secondary | ICD-10-CM

## 2015-05-12 DIAGNOSIS — I119 Hypertensive heart disease without heart failure: Secondary | ICD-10-CM | POA: Diagnosis not present

## 2015-05-12 LAB — HEPATIC FUNCTION PANEL
ALT: 13 U/L (ref 6–29)
AST: 19 U/L (ref 10–35)
Albumin: 3.5 g/dL — ABNORMAL LOW (ref 3.6–5.1)
Alkaline Phosphatase: 81 U/L (ref 33–130)
Bilirubin, Direct: <0.1 mg/dL
Total Bilirubin: 0.6 mg/dL (ref 0.2–1.2)
Total Protein: 6.7 g/dL (ref 6.1–8.1)

## 2015-05-12 LAB — CBC WITH DIFFERENTIAL/PLATELET
BASOS ABS: 0.1 10*3/uL (ref 0.0–0.1)
Basophils Relative: 1 % (ref 0–1)
EOS ABS: 0.2 10*3/uL (ref 0.0–0.7)
EOS PCT: 3 % (ref 0–5)
HCT: 35 % — ABNORMAL LOW (ref 36.0–46.0)
Hemoglobin: 11.1 g/dL — ABNORMAL LOW (ref 12.0–15.0)
LYMPHS ABS: 1.6 10*3/uL (ref 0.7–4.0)
Lymphocytes Relative: 24 % (ref 12–46)
MCH: 28.4 pg (ref 26.0–34.0)
MCHC: 31.7 g/dL (ref 30.0–36.0)
MCV: 89.5 fL (ref 78.0–100.0)
MONO ABS: 0.7 10*3/uL (ref 0.1–1.0)
MONOS PCT: 11 % (ref 3–12)
MPV: 10.6 fL (ref 8.6–12.4)
Neutro Abs: 4 10*3/uL (ref 1.7–7.7)
Neutrophils Relative %: 61 % (ref 43–77)
PLATELETS: 321 10*3/uL (ref 150–400)
RBC: 3.91 MIL/uL (ref 3.87–5.11)
RDW: 15.1 % (ref 11.5–15.5)
WBC: 6.5 10*3/uL (ref 4.0–10.5)

## 2015-05-12 LAB — LIPID PANEL
Cholesterol: 139 mg/dL (ref 125–200)
HDL: 79 mg/dL (ref >=46)
LDL Cholesterol: 50 mg/dL (ref <130)
Total CHOL/HDL Ratio: 1.8 Ratio (ref <=5.0)
Triglycerides: 50 mg/dL (ref <150)
VLDL: 10 mg/dL (ref <30)

## 2015-05-12 LAB — BASIC METABOLIC PANEL WITH GFR
BUN: 13 mg/dL (ref 7–25)
CO2: 26 mmol/L (ref 20–31)
Calcium: 9 mg/dL (ref 8.6–10.4)
Chloride: 105 mmol/L (ref 98–110)
Creat: 0.95 mg/dL — ABNORMAL HIGH (ref 0.60–0.93)
Glucose, Bld: 85 mg/dL (ref 65–99)
Potassium: 4.5 mmol/L (ref 3.5–5.3)
Sodium: 140 mmol/L (ref 135–146)

## 2015-05-12 NOTE — Progress Notes (Signed)
Quick Note:  Please report to patient. The recent labs are stable. Continue same medication and careful diet. Hemoglobin remains mildly depressed. Continue multivitamin. Have the colonoscopy as recommended by GI. ______

## 2015-05-12 NOTE — Progress Notes (Signed)
Cardiology Office Note   Date:  05/12/2015   ID:  Autumn Moss, DOB 1940-09-06, MRN [id number]  PCP:  Penni Homans, MD  Cardiologist: Darlin Coco MD  Chief Complaint  Patient presents with  . Follow-up    a fib   denies cp/sob/le edema      History of Present Illness: Autumn Moss is a 75 y.o. female who presents for scheduled visit  This pleasant 75 year old woman is seen for a followup office visit. She has a history of palpitations and atypical chest pain. She also has a past history of paroxysmal atrial fibrillation. She has a history of hypercholesterolemia. She does not have any history of ischemic heart disease. She has not had an ischemic testing. She did have an echocardiogram in 11/20/11 which showed normal left ventricular ejection fraction of 55-60% and trivial aortic insufficiency.  In July 2013 the patient presented with recurrent atrial fibrillation. Her Toprol was increased to 50 mg twice a day and she was placed on Xarelto. When she returned today later for her echocardiogram she was back in sinus rhythm and her Xarelto was stopped and she was advised to go back on just one Toprol daily. 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  In late September 2013 the patient went to the hospital at [facility] and was diagnosed with a TIA. She had complete recovery and resolution of her symptoms. Xarelto was added back at that time. She had some carotid disease on the left with 50-69% narrowing. 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 She was bradycardic and her beta blocker was cut way back at that time. Because of the previous small left insular cortical infarct she will remain on long-term anticoagulation in view of her paroxysmal atrial fibrillation. Her last episode of atrial fibrillation began on 01/10/13. We saw her on 01/13/13 and added  flecainide 50 mg twice a day. She called back the next day to tell us that she had converted back to normal sinus rhythm. The patient has mild carotid artery disease. Her most recent carotid duplex study on 12/18/12 showed less than 40% stenosis in each carotid.  She had a follow-up carotid Doppler on 02/16/15 which showed further improvement and she had less than 39% stenosis bilaterally and was told that she did not need to come back for another study for 2 years The patient exercises regularly. She does 30 minutes on the treadmill and then another 30 or so on the elliptical machine and she does that at the [facility] 3 days a week or more. She does not have any history of exertional chest pain or known ischemic heart disease. She has had no further TIA symptoms.  At her office visit her on 07/27/13 her flecainide was increased to 50 mg in the morning and 100 mg in the evening and she has had no further atrial fibrillation and feels well. She reports that since her last visit she has not had any recurrent episodes of atrial fibrillation.   Past Medical History  Diagnosis Date  . Hypertension   . Paroxysmal atrial fibrillation (HCC)   . Palpitations     sporadic  . Hyperlipidemia   . Aortic valve disease     mild - 07/05/05 per echocardiogram   . Left ventricular diastolic dysfunction     mild - 07/05/05 per echocardiogram  . COPD (chronic obstructive pulmonary disease) ([place])   .  Ectopic pregnancy     history of x2  . Dyspepsia     occasional  . History of echocardiogram 01/26/2009    Est. EF of 55-60%  --  MILD MITRAL REGURGITATION  -  SINCE LAST ECHO OF 07/10/05 NO SIGNIFICANT CHANGES.  ---  Autumn Faster Elaysha Bevard, MD  . Sinusitis acute 04/10/2011  . 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 Chronic anticoagulation     short course of Xarelto  . TIA (transient ischemic attack) 01/2012    back on Xarelto  . Bradycardia, sinus   . Cataract 06/28/2013    b/l  . Macular degeneration 06/28/2013  . Medicare  annual wellness visit, subsequent 12/27/2013  . Benign essential HTN 04/26/2014  . Low back pain 04/26/2014  . Anemia 02/14/2015    Past Surgical History  Procedure Laterality Date  . Ectopic pregnancy surgery      bilateral, both tubes excised and 1 ovary excised  . Oophorectomy    . Appendectomy       Current Outpatient Prescriptions  Medication Sig Dispense Refill  . Cholecalciferol (VITAMIN D-3) 1000 UNITS CAPS Take 1 capsule by mouth daily.     . flecainide (TAMBOCOR) 50 MG tablet 1 tablet in the morning and 2 tablets in the evening 270 tablet 3  . Lutein 20 MG CAPS Take 1 capsule by mouth daily.    . metoprolol succinate (TOPROL-XL) 25 MG 24 hr tablet Take 0.5 tablets (12.5 mg total) by mouth 2 (two) times daily. 90 tablet 3  . multivitamin (THERAGRAN) per tablet Take 1 tablet by mouth daily.      Marland Kitchen omeprazole (PRILOSEC) 20 MG capsule Take 1 capsule (20 mg total) by mouth 2 (two) times daily. 180 capsule 3  . Probiotic Product (PROBIOTIC DAILY PO) Take 1 capsule by mouth daily.     . rivaroxaban (XARELTO) 20 MG TABS tablet TAKE 1 TABLET BY MOUTH DAILY WITH SUPPER 30 tablet 11  . simvastatin (ZOCOR) 20 MG tablet Take 1 tablet (20 mg total) by mouth daily. 90 tablet 3   No current facility-administered medications for this visit.    Allergies:   Review of patient's allergies indicates no known allergies.    Social History:  The patient  reports that she quit smoking about 9 years ago. Her smoking use included Cigarettes. She has a 12.5 pack-year smoking history. She has never used smokeless tobacco. She reports that she does not drink alcohol or use illicit drugs.   Family History:  The patient's family history includes Arthritis in her brother and mother; Atrial fibrillation in her brother; Breast cancer in her sister; Cancer in her father; Hypertension in her brother and mother; Kidney failure in her mother; Scleroderma in her daughter; Sleep apnea in her brother; Stroke in  her mother; Thyroid disease in her brother. There is no history of Heart attack.    ROS:  Please see the history of present illness.   Otherwise, review of systems are positive for none.   All other systems are reviewed and negative.    PHYSICAL EXAM: VS:  BP 150/76 mmHg  Pulse 55  Ht 5\' 5"  (1.651 m)  Wt 189 lb 12.8 oz (86.093 kg)  BMI 31.58 kg/m2 , BMI Body mass index is 31.58 kg/(m^2). GEN: Well nourished, well developed, in no acute distress HEENT: normal Neck: no JVD, carotid bruits, or masses Cardiac: RRR; no murmurs, rubs, or gallops,no edema  Respiratory:  clear to auscultation bilaterally, normal work of breathing GI: soft, nontender, nondistended, +  BS MS: no deformity or atrophy Skin: warm and dry, no rash Neuro:  Strength and sensation are intact Psych: euthymic mood, full affect   EKG:  EKG is ordered today. The ekg ordered today demonstrates sinus bradycardia with first-degree AV block.  Otherwise normal EKG.   Recent Labs: 02/01/2015: ALT 14; BUN 14; Creatinine, Ser 0.88; Potassium 4.1; Sodium 140; TSH 3.08 05/12/2015: Hemoglobin 11.1*; Platelets 321    Lipid Panel    Component Value Date/Time   CHOL 164 02/01/2015 1405   TRIG 55.0 02/01/2015 1405   HDL 70.40 02/01/2015 1405   CHOLHDL 2 02/01/2015 1405   VLDL 11.0 02/01/2015 1405   LDLCALC 83 02/01/2015 1405   LDLDIRECT 156.6 10/17/2010 0814      Wt Readings from Last 3 Encounters:  05/12/15 189 lb 12.8 oz (86.093 kg)  02/14/15 186 lb 2 oz (84.426 kg)  02/01/15 188 lb 6.4 oz (85.458 kg)        ASSESSMENT AND PLAN:  1. Paroxysmal atrial fibrillation. No recurrence on current dose of flecainide 2. Hypercholesterolemia 3. Essential hypertension.  Controlled on current medication.  Pressure here today is slightly high which is attributed to whitecoat syndrome.  At home she checks her blood pressure regularly and it is always in the 123456 systolic range.  She has gained 3 pounds over the holidays and  she will work hard to get those extra pounds off 4. history of TIA in September 2013. Most recent carotid duplex on 02/16/15 shows less than 40% stenosis in each carotid.   Current medicines are reviewed at length with the patient today.  The patient does not have concerns regarding medicines.  The following changes have been made:  no change  Labs/ tests ordered today include:   Orders Placed This Encounter  Procedures  . EKG 12-Lead   Physician: Continue current medication.  She will return in 4 months for follow-up office visit and CBC with Dr. Marlou Porch or Dr. Stanford Breed.  She is chronically mildly anemic.  It is time for her 5 year colonoscopy check up with Dr. Watt Climes, and I encouraged her to proceed with that  Signed, Darlin Coco MD 05/12/2015 4:58 PM    [address], [place], [place]  [address] Phone: [phone]; Fax: [phone]

## 2015-05-12 NOTE — Patient Instructions (Signed)
Medication Instructions:  Your physician recommends that you continue on your current medications as directed. Please refer to the Current Medication list given to you today.  Labwork: NONE  Testing/Procedures: NONE  Follow-Up: Your physician wants you to follow-up in: [place] will receive a reminder letter in the mail two months in advance. If you don't receive a letter, please call our office to schedule the follow-up appointment.   If you need a refill on your cardiac medications before your next appointment, please call your pharmacy.

## 2015-05-22 DIAGNOSIS — I10 Essential (primary) hypertension: Secondary | ICD-10-CM | POA: Diagnosis not present

## 2015-05-25 ENCOUNTER — Encounter: Payer: Self-pay | Admitting: Cardiology

## 2015-05-25 ENCOUNTER — Telehealth: Payer: Self-pay | Admitting: Cardiology

## 2015-05-25 ENCOUNTER — Ambulatory Visit (INDEPENDENT_AMBULATORY_CARE_PROVIDER_SITE_OTHER): Payer: Medicare Other | Admitting: Cardiology

## 2015-05-25 VITALS — BP 156/74 | HR 58 | Ht 65.0 in | Wt 187.8 lb

## 2015-05-25 DIAGNOSIS — I119 Hypertensive heart disease without heart failure: Secondary | ICD-10-CM

## 2015-05-25 DIAGNOSIS — I480 Paroxysmal atrial fibrillation: Secondary | ICD-10-CM

## 2015-05-25 DIAGNOSIS — Z7901 Long term (current) use of anticoagulants: Secondary | ICD-10-CM

## 2015-05-25 MED ORDER — LOSARTAN POTASSIUM 25 MG PO TABS: 25.0000 mg | ORAL_TABLET | Freq: Every day | ORAL | Status: DC

## 2015-05-25 NOTE — Patient Instructions (Signed)
Medication Instructions:  START LOSARTAN 25 MG DAILY  Labwork: NONE  Testing/Procedures: NONE  Follow-Up: Your physician recommends that you schedule a follow-up appointment in: IN April WITH DR Stanford Breed   Any Other Special Instructions Will Be Listed Below (If Applicable). WORK HADER ON DIET (AVOID SALT) AND WEIGHT LOSS  If you need a refill on your cardiac medications before your next appointment, please call your pharmacy.

## 2015-05-25 NOTE — Telephone Encounter (Signed)
Spoke with patient and she is seeing  Dr. Mare Ferrari this afternoon, ok per  Dr. Mare Ferrari

## 2015-05-25 NOTE — Telephone Encounter (Signed)
Pt c/o BP issue: STAT if pt c/o blurred vision, one-sided weakness or slurred speech  1. What are your last 5 BP readings?  190/79 1hr ago 176/80  Few minutes  2. Are you having any other symptoms (ex. Dizziness, headache, blurred vision, passed out)? no  3. What is your BP issue? bp is to high  Pt wish to come in for an appt today.

## 2015-05-25 NOTE — Progress Notes (Signed)
Cardiology Office Note   Date:  05/25/2015   ID:  [name], DOB May 30, 1940, MRN [id number]  PCP:  Penni Homans, MD  Cardiologist: Darlin Coco MD  Chief Complaint  Patient presents with  . Hypertension    Patient denies any chest pain, shortness of breath, le edema, or claudication      History of Present Illness: Autumn Moss is a 75 y.o. female who presents for workin evaluation of elevated blood pressure  . She has a history of palpitations and atypical chest pain. She also has a past history of paroxysmal atrial fibrillation. She has a history of hypercholesterolemia. She does not have any history of ischemic heart disease. She has not had an ischemic testing. She did have an echocardiogram in 11/20/11 which showed normal left ventricular ejection fraction of 55-60% and trivial aortic insufficiency.  In July 2013 the patient presented with recurrent atrial fibrillation. Her Toprol was increased to 50 mg twice a day and she was placed on Xarelto. When she returned today later for her echocardiogram she was back in sinus rhythm and her Xarelto was stopped and she was advised to go back on just one Toprol daily. 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  In late September 2013 the patient went to the hospital at [facility] and was diagnosed with a TIA. She had complete recovery and resolution of her symptoms. Xarelto was added back at that time. She had some carotid disease on the left with 50-69% narrowing. 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 She was bradycardic and her beta blocker was cut way back at that time. Because of the previous small left insular cortical infarct she will remain on long-term anticoagulation in view of her paroxysmal atrial fibrillation. Her last episode of atrial fibrillation began on 01/10/13. We saw her on 01/13/13  and added flecainide 50 mg twice a day. She called back the next day to tell us that she had converted back to normal sinus rhythm. The patient has mild carotid artery disease. Her most recent carotid duplex study on 12/18/12 showed less than 40% stenosis in each carotid. She had a follow-up carotid Doppler on 02/16/15 which showed further improvement and she had less than 39% stenosis bilaterally and was told that she did not need to come back for another study for 2 years The patient exercises regularly. She does 30 minutes on the treadmill and then another 30 or so on the elliptical machine and she does that at the [facility] 3 days a week or more. She does not have any history of exertional chest pain or known ischemic heart disease. She has had no further TIA symptoms.  At her office visit her on 07/27/13 her flecainide was increased to 50 mg in the morning and 100 mg in the evening and she has had no further atrial fibrillation and feels well. She reports that since her last visit she has not had any recurrent episodes of atrial fibrillation. The patient has had a history of essential hypertension.  Recently her blood pressure has been running higher particularly at night.  On one occasion recently her systolic was 99991111.  During the day her blood pressure appears to be reasonably normal.  The patient has been trying to watch her diet.  Her weight is down 2 pounds.  She is trying to avoid dietary salt.  She has normal renal  function.   Past Medical History  Diagnosis Date  . Hypertension   . Paroxysmal atrial fibrillation (HCC)   . Palpitations     sporadic  . Hyperlipidemia   . Aortic valve disease     mild - 07/05/05 per echocardiogram   . Left ventricular diastolic dysfunction     mild - 07/05/05 per echocardiogram  . COPD (chronic obstructive pulmonary disease) ([place])   . Ectopic pregnancy     history of x2  . Dyspepsia     occasional  . History of echocardiogram 01/26/2009    Est. EF of  55-60%  --  MILD MITRAL REGURGITATION  -  SINCE LAST ECHO OF 07/10/05 NO SIGNIFICANT CHANGES.  ---  Autumn Faster Latavius Capizzi, MD  . Sinusitis acute 04/10/2011  . 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 Chronic anticoagulation     short course of Xarelto  . TIA (transient ischemic attack) 01/2012    back on Xarelto  . Bradycardia, sinus   . Cataract 06/28/2013    b/l  . Macular degeneration 06/28/2013  . Medicare annual wellness visit, subsequent 12/27/2013  . Benign essential HTN 04/26/2014  . Low back pain 04/26/2014  . Anemia 02/14/2015    Past Surgical History  Procedure Laterality Date  . Ectopic pregnancy surgery      bilateral, both tubes excised and 1 ovary excised  . Oophorectomy    . Appendectomy       Current Outpatient Prescriptions  Medication Sig Dispense Refill  . Cholecalciferol (VITAMIN D-3) 1000 UNITS CAPS Take 1 capsule by mouth daily.     . flecainide (TAMBOCOR) 50 MG tablet Take 50 mg by mouth. Take 1 tablet by mouth daily in the morning and take 2 tablets by mouth daily in the evening    . Lutein 20 MG CAPS Take 1 capsule by mouth daily.    . metoprolol succinate (TOPROL-XL) 25 MG 24 hr tablet Take 0.5 tablets (12.5 mg total) by mouth 2 (two) times daily. 90 tablet 3  . multivitamin (THERAGRAN) per tablet Take 1 tablet by mouth daily.      Marland Kitchen omeprazole (PRILOSEC) 20 MG capsule Take 1 capsule (20 mg total) by mouth 2 (two) times daily. 180 capsule 3  . Probiotic Product (PROBIOTIC DAILY PO) Take 1 capsule by mouth daily.     . rivaroxaban (XARELTO) 20 MG TABS tablet TAKE 1 TABLET BY MOUTH DAILY WITH SUPPER 30 tablet 11  . simvastatin (ZOCOR) 20 MG tablet Take 1 tablet (20 mg total) by mouth daily. 90 tablet 3  . losartan (COZAAR) 25 MG tablet Take 1 tablet (25 mg total) by mouth daily. WITH EVENING MEAL 30 tablet 5   No current facility-administered medications for this visit.    Allergies:   Review of patient's allergies indicates no known allergies.    Social History:   The patient  reports that she quit smoking about 9 years ago. Her smoking use included Cigarettes. She has a 12.5 pack-year smoking history. She has never used smokeless tobacco. She reports that she does not drink alcohol or use illicit drugs.   Family History:  The patient's family history includes Arthritis in her brother and mother; Atrial fibrillation in her brother; Breast cancer in her sister; Cancer in her father; Hypertension in her brother and mother; Kidney failure in her mother; Scleroderma in her daughter; Sleep apnea in her brother; Stroke in her mother; Thyroid disease in her brother. There is no history of Heart attack.    ROS:  Please see the history of present illness.   Otherwise, review of systems are positive for none.   All other systems are reviewed and negative.    PHYSICAL EXAM: VS:  BP 156/74 mmHg  Pulse 58  Ht 5\' 5"  (1.651 m)  Wt 187 lb 12.8 oz (85.186 kg)  BMI 31.25 kg/m2 , BMI Body mass index is 31.25 kg/(m^2). GEN: Well nourished, well developed, in no acute distress HEENT: normal Neck: no JVD, carotid bruits, or masses Cardiac: RRR; no murmurs, rubs, or gallops,no edema  Respiratory:  clear to auscultation bilaterally, normal work of breathing GI: soft, nontender, nondistended, + BS MS: no deformity or atrophy Skin: warm and dry, no rash Neuro:  Strength and sensation are intact Psych: euthymic mood, full affect   EKG:  EKG is ordered today. The ekg ordered today demonstrates sinus bradycardia with first-degree AV block.  QTc interval is 445 ms on flecainide   Recent Labs: 02/01/2015: TSH 3.08 05/12/2015: ALT 13; BUN 13; Creat 0.95*; Hemoglobin 11.1*; Platelets 321; Potassium 4.5; Sodium 140    Lipid Panel    Component Value Date/Time   CHOL 139 05/12/2015 0909   TRIG 50 05/12/2015 0909   HDL 79 05/12/2015 0909   CHOLHDL 1.8 05/12/2015 0909   VLDL 10 05/12/2015 0909   LDLCALC 50 05/12/2015 0909   LDLDIRECT 156.6 10/17/2010 0814      Wt  Readings from Last 3 Encounters:  05/25/15 187 lb 12.8 oz (85.186 kg)  05/12/15 189 lb 12.8 oz (86.093 kg)  02/14/15 186 lb 2 oz (84.426 kg)         ASSESSMENT AND PLAN:  1. Paroxysmal atrial fibrillation. No recurrence on current dose of flecainide 2. Hypercholesterolemia 3. Essential hypertension. Controlled on current medication. Pressure here today is slightly high which is attributed to whitecoat syndrome. At home she checks her blood pressure regularly and it is always in the 123456 systolic range. She has gained 3 pounds over the holidays and she will work hard to get those extra pounds off 4. history of TIA in September 2013. Most recent carotid duplex on 02/16/15 shows less than 40% stenosis in each carotid.   Current medicines are reviewed at length with the patient today.  The patient does not have concerns regarding medicines.  The following changes have been made:  We will start losartan 25 mg daily at supper.  Labs/ tests ordered today include:   Orders Placed This Encounter  Procedures  . EKG 12-Lead    Disposition: Continue current medication.  Start losartan.  Continue to avoid dietary salt.  Try to lose weight.  She will follow-up with Dr. Stanford Breed in April.  Berna Spare MD 05/25/2015 6:04 PM    [address], [place], [place]  [address] Phone: [phone]; Fax: [phone]

## 2015-06-03 ENCOUNTER — Telehealth: Payer: Self-pay

## 2015-06-03 NOTE — Telephone Encounter (Signed)
Clearance faxed to [facility] Attn: Dr. Watt Climes [phone] fax

## 2015-06-06 ENCOUNTER — Telehealth: Payer: Self-pay | Admitting: Cardiology

## 2015-06-06 MED ORDER — AMLODIPINE BESYLATE 5 MG PO TABS: 5.0000 mg | ORAL_TABLET | Freq: Every day | ORAL | Status: DC

## 2015-06-06 NOTE — Telephone Encounter (Signed)
New message    Patient calling     Pt c/o medication issue:  1. Name of Medication: losartan  25 mg   2. How are you currently taking this medication (dosage and times per day)? Daily with evening meal   3. Are you having a reaction (difficulty breathing--STAT)? Yes - heart racing  Blood pressure is still high , tired no energy   4. What is your medication issue? Wants to discuss

## 2015-06-06 NOTE — Telephone Encounter (Signed)
Please call.

## 2015-06-06 NOTE — Telephone Encounter (Signed)
Stop the losartan. Add amlodipine 5 mg one daily to help with blood pressure reduction

## 2015-06-06 NOTE — Telephone Encounter (Signed)
Advised patient, verbalized understanding  

## 2015-06-06 NOTE — Telephone Encounter (Signed)
Since starting the Losartan she has not been feeling well Blood pressure still running high, systolic never gets below 140 During the night if she gets up to urinate when she gets back in the bed her heart feels like it is beating fast/skipping and short of breath  States she feeling depressed and just not good All of this started after starting the Losartan and feels problems secondary to new med Patient takes meds as follows: AM Metoprolol 25 mg1/2 tablet  Flecainide 50 mg 1 tablet  Prilosec 20 mg 1 tablet   PM Metoprolol 25 mg 1/2 tablet  Flecainide 50 mg 2 tablets  Prilosec 20 mg 1 tablet  Losartan 25 mg 1 tablet  Xarelto 20 mg 1 tablet  HS Simvastatin  20 mg 1 tablet  Will forward to  Dr. Mare Ferrari for review

## 2015-06-20 ENCOUNTER — Telehealth: Payer: Self-pay | Admitting: *Deleted

## 2015-06-20 NOTE — Telephone Encounter (Signed)
Received physician order; forwarded to provider/SLS 02/13

## 2015-06-28 ENCOUNTER — Telehealth: Payer: Self-pay | Admitting: Cardiology

## 2015-06-28 NOTE — Telephone Encounter (Signed)
Pt thinks she needs to be seen. She have been having atrial fib at night. Please call to advise.

## 2015-06-28 NOTE — Telephone Encounter (Signed)
Agree with plan 

## 2015-06-28 NOTE — Telephone Encounter (Signed)
Spoke with pt. She does not think she is having atrial fib but is having problems with heart pounding and beating fast at night. Also blood pressure up at night. She is fine during the day. Not sure if it could be related to anxiety.  Is taking Xarelto.  Sleeping poorly.  Has been at beach and is on the way home and is requesting office visit with Dr. Mare Ferrari.  Appt made for her to see Dr. Mare Ferrari tomorrow at 3:30

## 2015-06-29 ENCOUNTER — Other Ambulatory Visit: Payer: Self-pay

## 2015-06-29 ENCOUNTER — Encounter: Payer: Self-pay | Admitting: Cardiology

## 2015-06-29 ENCOUNTER — Ambulatory Visit (INDEPENDENT_AMBULATORY_CARE_PROVIDER_SITE_OTHER): Payer: Medicare Other | Admitting: Cardiology

## 2015-06-29 VITALS — BP 142/76 | HR 58 | Ht 65.0 in | Wt 184.0 lb

## 2015-06-29 DIAGNOSIS — R002 Palpitations: Secondary | ICD-10-CM | POA: Insufficient documentation

## 2015-06-29 DIAGNOSIS — R011 Cardiac murmur, unspecified: Secondary | ICD-10-CM | POA: Diagnosis not present

## 2015-06-29 NOTE — Progress Notes (Signed)
Cardiology Office Note   Date:  06/29/2015   ID:  Autumn Moss, DOB Nov 17, 1940, MRN [id number]  PCP:  Penni Homans, MD  Cardiologist: Darlin Coco MD  Chief Complaint  Patient presents with  . Hypertension      History of Present Illness: Autumn Moss is a 75 y.o. female who presents for work in follow-up visit.   She has a history of palpitations and atypical chest pain. She also has a past history of paroxysmal atrial fibrillation. She has a history of hypercholesterolemia. She does not have any history of ischemic heart disease. She has not had any ischemic testing. She did have an echocardiogram in 11/20/11 which showed normal left ventricular ejection fraction of 55-60% and trivial aortic insufficiency.  In July 2013 the patient presented with recurrent atrial fibrillation. Her Toprol was increased to 50 mg twice a day and she was placed on Xarelto. When she returned today later for her echocardiogram she was back in sinus rhythm and her Xarelto was stopped and she was advised to go back on just one Toprol daily. 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  In late September 2013 the patient went to the hospital at [facility] and was diagnosed with a TIA. She had complete recovery and resolution of her symptoms. Xarelto was added back at that time. She had some carotid disease on the left with 50-69% narrowing. 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 She was bradycardic and her beta blocker was cut way back at that time. Because of the previous small left insular cortical infarct she will remain on long-term anticoagulation in view of her paroxysmal atrial fibrillation. Her last episode of atrial fibrillation began on 01/10/13. We saw her on 01/13/13 and added flecainide 50 mg twice a day. She called back the next day to tell us that she had converted  back to normal sinus rhythm. The patient has mild carotid artery disease. Her most recent carotid duplex study on 12/18/12 showed less than 40% stenosis in each carotid. She had a follow-up carotid Doppler on 02/16/15 which showed further improvement and she had less than 39% stenosis bilaterally and was told that she did not need to come back for another study for 2 years The patient exercises regularly. She does 30 minutes on the treadmill and then another 30 or so on the elliptical machine and she does that at the [facility] 3 days a week or more. She does not have any history of exertional chest pain or known ischemic heart disease. She has had no further TIA symptoms.  At her office visit her on 07/27/13 her flecainide was increased to 50 mg in the morning and 100 mg in the evening and she has had no further atrial fibrillation and feels well. She reports that since her last visit she has not had any recurrent episodes of atrial fibrillation. She comes in today because of concern over palpitations which are occurring at night as she is getting ready to go to bed.  These are different from her previous paroxysmal atrial fibrillation episodes.  It is not clear as to whether these are isolated PVCs or PACs were something else.  We will have her wear a 30 day monitor.  The palpitations do not occur every night but chest some nights.  She can also hear her heartbeat up in her head and this makes her worried  as well whenever her heart rate is irregular. Systolic heart murmur is more prominent today and we will update her echo.  Past Medical History  Diagnosis Date  . Hypertension   . Paroxysmal atrial fibrillation (HCC)   . Palpitations     sporadic  . Hyperlipidemia   . Aortic valve disease     mild - 07/05/05 per echocardiogram   . Left ventricular diastolic dysfunction     mild - 07/05/05 per echocardiogram  . COPD (chronic obstructive pulmonary disease) ([place])   . Ectopic pregnancy     history of x2  .  Dyspepsia     occasional  . History of echocardiogram 01/26/2009    Est. EF of 55-60%  --  MILD MITRAL REGURGITATION  -  SINCE LAST ECHO OF 07/10/05 NO SIGNIFICANT CHANGES.  ---  Joyice Faster Jasara Corrigan, MD  . Sinusitis acute 04/10/2011  . 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 Chronic anticoagulation     short course of Xarelto  . TIA (transient ischemic attack) 01/2012    back on Xarelto  . Bradycardia, sinus   . Cataract 06/28/2013    b/l  . Macular degeneration 06/28/2013  . Medicare annual wellness visit, subsequent 12/27/2013  . Benign essential HTN 04/26/2014  . Low back pain 04/26/2014  . Anemia 02/14/2015    Past Surgical History  Procedure Laterality Date  . Ectopic pregnancy surgery      bilateral, both tubes excised and 1 ovary excised  . Oophorectomy    . Appendectomy       Current Outpatient Prescriptions  Medication Sig Dispense Refill  . amLODipine (NORVASC) 5 MG tablet Take 1 tablet (5 mg total) by mouth daily. 30 tablet 5  . Cholecalciferol (VITAMIN D-3) 1000 UNITS CAPS Take 1 capsule by mouth daily.     . flecainide (TAMBOCOR) 50 MG tablet Take 50 mg by mouth. Take 1 tablet by mouth daily in the morning and take 2 tablets by mouth daily in the evening    . Lutein 20 MG CAPS Take 1 capsule by mouth daily.    . metoprolol succinate (TOPROL-XL) 25 MG 24 hr tablet Take 0.5 tablets (12.5 mg total) by mouth 2 (two) times daily. 90 tablet 3  . multivitamin (THERAGRAN) per tablet Take 1 tablet by mouth daily.      Marland Kitchen omeprazole (PRILOSEC) 20 MG capsule Take 1 capsule (20 mg total) by mouth 2 (two) times daily. 180 capsule 3  . Probiotic Product (PROBIOTIC DAILY PO) Take 1 capsule by mouth daily.     . rivaroxaban (XARELTO) 20 MG TABS tablet TAKE 1 TABLET BY MOUTH DAILY WITH SUPPER 30 tablet 11  . simvastatin (ZOCOR) 20 MG tablet Take 1 tablet (20 mg total) by mouth daily. 90 tablet 3   No current facility-administered medications for this visit.    Allergies:   Losartan     Social History:  The patient  reports that she quit smoking about 10 years ago. Her smoking use included Cigarettes. She has a 12.5 pack-year smoking history. She has never used smokeless tobacco. She reports that she does not drink alcohol or use illicit drugs.   Family History:  The patient's family history includes Arthritis in her brother and mother; Atrial fibrillation in her brother; Breast cancer in her sister; Cancer in her father; Hypertension in her brother and mother; Kidney failure in her mother; Scleroderma in her daughter; Sleep apnea in her brother; Stroke in her mother; Thyroid disease in her brother. There is  no history of Heart attack.    ROS:  Please see the history of present illness.   Otherwise, review of systems are positive for none.   All other systems are reviewed and negative.    PHYSICAL EXAM: VS:  BP 142/76 mmHg  Pulse 58  Ht 5\' 5"  (1.651 m)  Wt 184 lb (83.462 kg)  BMI 30.62 kg/m2 , BMI Body mass index is 30.62 kg/(m^2). GEN: Well nourished, well developed, in no acute distress HEENT: normal Neck: no JVD, carotid bruits, or masses Cardiac: RRR; there is a grade 2/6 systolic ejection murmur at the aortic area.  No diastolic murmur heard.  No rubs, or gallops,no edema  Respiratory:  clear to auscultation bilaterally, normal work of breathing GI: soft, nontender, nondistended, + BS MS: no deformity or atrophy Skin: warm and dry, no rash Neuro:  Strength and sensation are intact Psych: euthymic mood, full affect   EKG:  EKG is ordered today. The ekg ordered today demonstrates sinus bradycardia with first-degree AV block.  Since prior tracing of 05/25/15, no significant change.   Recent Labs: 02/01/2015: TSH 3.08 05/12/2015: ALT 13; BUN 13; Creat 0.95*; Hemoglobin 11.1*; Platelets 321; Potassium 4.5; Sodium 140    Lipid Panel    Component Value Date/Time   CHOL 139 05/12/2015 0909   TRIG 50 05/12/2015 0909   HDL 79 05/12/2015 0909   CHOLHDL 1.8  05/12/2015 0909   VLDL 10 05/12/2015 0909   LDLCALC 50 05/12/2015 0909   LDLDIRECT 156.6 10/17/2010 0814      Wt Readings from Last 3 Encounters:  06/29/15 184 lb (83.462 kg)  05/25/15 187 lb 12.8 oz (85.186 kg)  05/12/15 189 lb 12.8 oz (86.093 kg)         ASSESSMENT AND PLAN:  1. Paroxysmal atrial fibrillation. No recurrence on current dose of flecainide 2. Hypercholesterolemia 3. Essential hypertension. Controlled on current medication. 4.  Prior TIA.   5.  Mild carotid artery disease.  Carotid Doppler 02/16/15 shows less than 40% stenosis in each carotid. 5.  Symptomatic nocturnal palpitations usually occurring and she is preparing to go to bed at night. 6.  Aortic valve disease.  Prior echocardiogram in 2013 showed only aortic valve sclerosis.  The murmur appears to have increased in intensity.   Current medicines are reviewed at length with the patient today.  The patient does not have concerns regarding medicines.  The following changes have been made:  no change  Labs/ tests ordered today include:   Orders Placed This Encounter  Procedures  . Cardiac event monitor  . EKG 12-Lead  . ECHOCARDIOGRAM COMPLETE     Disposition:   Continue current medication.  We will arrange for a 30 day event monitor and a two-dimensional echocardiogram. As previously planned, we will have her follow-up with Dr. Stanford Breed at [facility] in April 2017  Signed, Darlin Coco MD 06/29/2015 5:42 PM    [address], [place], [place]  [address] Phone: [phone]; Fax: [phone]

## 2015-06-29 NOTE — Patient Instructions (Signed)
Medication Instructions:  Your physician recommends that you continue on your current medications as directed. Please refer to the Current Medication list given to you today.  Labwork: NONE  Testing/Procedures: Your physician has requested that you have an echocardiogram. Echocardiography is a painless test that uses sound waves to create images of your heart. It provides your doctor with information about the size and shape of your heart and how well your heart's chambers and valves are working. This procedure takes approximately one hour. There are no restrictions for this procedure.  Your physician has recommended that you wear an event monitor. Event monitors are medical devices that record the heart's electrical activity. Doctors most often [place] these monitors to diagnose arrhythmias. Arrhythmias are problems with the speed or rhythm of the heartbeat. The monitor is a small, portable device. You can wear one while you do your normal daily activities. This is usually used to diagnose what is causing palpitations/syncope (passing out).  Follow-Up: KEEP April APPOINTMENT WITH DR Stanford Breed   If you need a refill on your cardiac medications before your next appointment, please call your pharmacy.

## 2015-06-30 ENCOUNTER — Telehealth: Payer: Self-pay | Admitting: Cardiology

## 2015-06-30 ENCOUNTER — Ambulatory Visit (INDEPENDENT_AMBULATORY_CARE_PROVIDER_SITE_OTHER): Payer: Medicare Other

## 2015-06-30 DIAGNOSIS — R011 Cardiac murmur, unspecified: Secondary | ICD-10-CM

## 2015-06-30 DIAGNOSIS — R002 Palpitations: Secondary | ICD-10-CM | POA: Diagnosis not present

## 2015-06-30 NOTE — Telephone Encounter (Signed)
Please call,she said she need to talk to you about the monitor that she will be hooked up to.

## 2015-06-30 NOTE — Telephone Encounter (Signed)
Moved up patients appointment for monitor to today, patient aware

## 2015-07-01 ENCOUNTER — Ambulatory Visit (HOSPITAL_COMMUNITY): Payer: Medicare Other | Attending: Cardiovascular Disease

## 2015-07-01 ENCOUNTER — Other Ambulatory Visit: Payer: Self-pay

## 2015-07-01 DIAGNOSIS — R002 Palpitations: Secondary | ICD-10-CM | POA: Diagnosis not present

## 2015-07-01 DIAGNOSIS — I071 Rheumatic tricuspid insufficiency: Secondary | ICD-10-CM | POA: Insufficient documentation

## 2015-07-01 DIAGNOSIS — I351 Nonrheumatic aortic (valve) insufficiency: Secondary | ICD-10-CM | POA: Diagnosis not present

## 2015-07-01 DIAGNOSIS — R011 Cardiac murmur, unspecified: Secondary | ICD-10-CM | POA: Insufficient documentation

## 2015-07-01 DIAGNOSIS — I509 Heart failure, unspecified: Secondary | ICD-10-CM | POA: Diagnosis not present

## 2015-07-01 DIAGNOSIS — Z87891 Personal history of nicotine dependence: Secondary | ICD-10-CM | POA: Insufficient documentation

## 2015-07-01 DIAGNOSIS — I340 Nonrheumatic mitral (valve) insufficiency: Secondary | ICD-10-CM | POA: Insufficient documentation

## 2015-07-01 DIAGNOSIS — I110 Hypertensive heart disease with heart failure: Secondary | ICD-10-CM | POA: Diagnosis not present

## 2015-07-01 DIAGNOSIS — E785 Hyperlipidemia, unspecified: Secondary | ICD-10-CM | POA: Insufficient documentation

## 2015-07-12 ENCOUNTER — Other Ambulatory Visit (HOSPITAL_COMMUNITY): Payer: Medicare Other

## 2015-08-15 ENCOUNTER — Ambulatory Visit: Payer: Medicare Other | Admitting: Family Medicine

## 2015-08-22 ENCOUNTER — Encounter: Payer: Self-pay | Admitting: Family Medicine

## 2015-08-22 ENCOUNTER — Ambulatory Visit (INDEPENDENT_AMBULATORY_CARE_PROVIDER_SITE_OTHER): Payer: Medicare Other | Admitting: Family Medicine

## 2015-08-22 VITALS — BP 140/60 | HR 48 | Temp 97.8°F | Ht 65.0 in | Wt 187.4 lb

## 2015-08-22 DIAGNOSIS — Z23 Encounter for immunization: Secondary | ICD-10-CM

## 2015-08-22 DIAGNOSIS — E782 Mixed hyperlipidemia: Secondary | ICD-10-CM

## 2015-08-22 DIAGNOSIS — E663 Overweight: Secondary | ICD-10-CM | POA: Diagnosis not present

## 2015-08-22 DIAGNOSIS — D509 Iron deficiency anemia, unspecified: Secondary | ICD-10-CM | POA: Diagnosis not present

## 2015-08-22 DIAGNOSIS — I10 Essential (primary) hypertension: Secondary | ICD-10-CM | POA: Diagnosis not present

## 2015-08-22 DIAGNOSIS — I480 Paroxysmal atrial fibrillation: Secondary | ICD-10-CM

## 2015-08-22 LAB — CBC
HCT: 34.6 % — ABNORMAL LOW (ref 36.0–46.0)
Hemoglobin: 11.2 g/dL — ABNORMAL LOW (ref 12.0–15.0)
MCHC: 32.3 g/dL (ref 30.0–36.0)
MCV: 89.8 fl (ref 78.0–100.0)
PLATELETS: 336 10*3/uL (ref 150.0–400.0)
RBC: 3.85 Mil/uL — AB (ref 3.87–5.11)
RDW: 15.9 % — ABNORMAL HIGH (ref 11.5–15.5)
WBC: 7 10*3/uL (ref 4.0–10.5)

## 2015-08-22 LAB — TSH: TSH: 3.61 u[IU]/mL (ref 0.35–4.50)

## 2015-08-22 LAB — HM MAMMOGRAPHY: HM MAMMO: NORMAL (ref 0–4)

## 2015-08-22 NOTE — Assessment & Plan Note (Signed)
Repeat CBC today 

## 2015-08-22 NOTE — Patient Instructions (Signed)
Bradycardia  Bradycardia is a slower-than-normal heart rate. A normal resting heart rate for an adult ranges from 60 to 100 beats per minute. With bradycardia, the resting heart rate is less than 60 beats per minute.  Bradycardia is a problem if your heart cannot pump enough oxygen-rich blood through your body. Bradycardia is not a problem for everyone. For some healthy adults, a slow resting heart rate is normal.   CAUSES   Bradycardia may be caused by:  · A problem with the heart's electrical system, such as heart block.  · A problem with the heart's natural pacemaker (sinus node).  · Heart disease, damage, or infection.  · Certain medicines that treat heart conditions.  · Certain conditions, such as hypothyroidism and obstructive sleep apnea.  RISK FACTORS   Risk factors include:  · Being 65 or older.  · Having high blood pressure (hypertension), high cholesterol (hyperlipidemia), or diabetes.  · Drinking heavily, using tobacco products, or using drugs.  · Being stressed.  SIGNS AND SYMPTOMS   Signs and symptoms include:  · Light-headedness.  · Fainting or near fainting.  · Fatigue and weakness.  · Shortness of breath.  · Chest pain (angina).  · Drowsiness.  · Confusion.  · Dizziness.  DIAGNOSIS   Diagnosis of bradycardia may include:  · A physical exam.  · An electrocardiogram (ECG).  · Blood tests.  TREATMENT   Treatment for bradycardia may include:  · Treatment of an underlying condition.  · Pacemaker placement. A pacemaker is a small, battery-powered device that is placed under the skin and is programmed to sense your heartbeats. If your heart rate is lower than the programmed rate, the pacemaker will pace your heart.  · Changing your medicines or dosages.  HOME CARE INSTRUCTIONS  · Take medicines only as directed by your health care provider.  · Manage any health conditions that contribute to bradycardia as directed by your health care provider.  · Follow a heart-healthy diet. A dietitian can help educate  you on healthy food options and changes.  · Follow an exercise program approved by your health care provider.  · Maintain a healthy weight. Lose weight as approved by your health care provider.  · Do not use tobacco products, including cigarettes, chewing tobacco, or electronic cigarettes. If you need help quitting, ask your health care provider.  · Do not use illegal drugs.  · Limit alcohol intake to no more than 1 drink per day for nonpregnant women and 2 drinks per day for men. One drink equals 12 ounces of beer, 5 ounces of wine, or 1½ ounces of hard liquor.  · Keep all follow-up visits as directed by your health care provider. This is important.  SEEK MEDICAL CARE IF:  · You feel light-headed or dizzy.  · You almost faint.  · You feel weak or are easily fatigued during physical activity.  · You experience confusion or have memory problems.  SEEK IMMEDIATE MEDICAL CARE IF:   · You faint.  · You have an irregular heartbeat.  · You have chest pain.  · You have trouble breathing.  MAKE SURE YOU:   · Understand these instructions.  · Will watch your condition.  · Will get help right away if you are not doing well or get worse.     This information is not intended to replace advice given to you by your health care provider. Make sure you discuss any questions you have with your health care provider.       Document Released: 01/13/2002 Document Revised: 05/14/2014 Document Reviewed: 07/29/2013  Elsevier Interactive Patient Education ©2016 [employer]

## 2015-08-22 NOTE — Assessment & Plan Note (Signed)
Well controlled, no changes to meds. Encouraged heart healthy diet such as the DASH diet and exercise as tolerated.  °

## 2015-08-22 NOTE — Progress Notes (Signed)
HPI: Follow-up atrial fibrillation. Previously followed by Dr. Mare Ferrari. Patient has a history of paroxysmal atrial fibrillation and is now controlled with flecainide. She has had previous TIA. Carotid Doppler 02/16/15 showed less than 39% stenosis bilaterally. Last echocardiogram February 2017 showed normal LV function, restrictive filling, mild aortic insufficiency, mild mitral regurgitation, moderate left atrial enlargement and mild to moderate tricuspid regurgitation. Monitor February 2017 showed sinus bradycardia with PACs and paroxysmal atrial fibrillation. Since last seen, She denies dyspnea, chest pain, palpitations or syncope. She had one brief episode of atrial fibrillation approximately one month ago.   Current Outpatient Prescriptions  Medication Sig Dispense Refill  . amLODipine (NORVASC) 5 MG tablet Take 1 tablet (5 mg total) by mouth daily. 30 tablet 5  . Cholecalciferol (VITAMIN D-3) 1000 UNITS CAPS Take 1 capsule by mouth daily.     . flecainide (TAMBOCOR) 50 MG tablet Take 50 mg by mouth. Take 1 tablet by mouth daily in the morning and take 2 tablets by mouth daily in the evening    . Lutein 20 MG CAPS Take 1 capsule by mouth daily.    . metoprolol succinate (TOPROL-XL) 25 MG 24 hr tablet Take 0.5 tablets (12.5 mg total) by mouth 2 (two) times daily. 90 tablet 3  . multivitamin (THERAGRAN) per tablet Take 1 tablet by mouth daily.      Marland Kitchen omeprazole (PRILOSEC) 20 MG capsule Take 1 capsule (20 mg total) by mouth 2 (two) times daily. 180 capsule 3  . Probiotic Product (PROBIOTIC DAILY PO) Take 1 capsule by mouth daily.     . rivaroxaban (XARELTO) 20 MG TABS tablet TAKE 1 TABLET BY MOUTH DAILY WITH SUPPER 30 tablet 11  . simvastatin (ZOCOR) 20 MG tablet Take 1 tablet (20 mg total) by mouth daily. 90 tablet 3   No current facility-administered medications for this visit.     Past Medical History  Diagnosis Date  . Hypertension   . Paroxysmal atrial fibrillation (HCC)     . Palpitations     sporadic  . Hyperlipidemia   . Aortic valve disease     mild - 07/05/05 per echocardiogram   . Left ventricular diastolic dysfunction     mild - 07/05/05 per echocardiogram  . COPD (chronic obstructive pulmonary disease) ([place])   . Ectopic pregnancy     history of x2  . Dyspepsia     occasional  . History of echocardiogram 01/26/2009    Est. EF of 55-60%  --  MILD MITRAL REGURGITATION  -  SINCE LAST ECHO OF 07/10/05 NO SIGNIFICANT CHANGES.  ---  Joyice Faster Brackbill, MD  . Sinusitis acute 04/10/2011  . 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 Chronic anticoagulation     short course of Xarelto  . TIA (transient ischemic attack) 01/2012    back on Xarelto  . Bradycardia, sinus   . Cataract 06/28/2013    b/l  . Macular degeneration 06/28/2013  . Medicare annual wellness visit, subsequent 12/27/2013  . Benign essential HTN 04/26/2014  . Low back pain 04/26/2014  . Anemia 02/14/2015    Past Surgical History  Procedure Laterality Date  . Ectopic pregnancy surgery      bilateral, both tubes excised and 1 ovary excised  . Oophorectomy    . Appendectomy      Social History   Social History  . Marital Status: Married    Spouse Name: Josph Macho  . Number of Children: 1  . Years of Education: N/A  Occupational History  .     Social History Main Topics  . Smoking status: Former Smoker -- 0.50 packs/day for 25 years    Types: Cigarettes    Quit date: 06/22/2005  . Smokeless tobacco: Never Used  . Alcohol Use: No  . Drug Use: No  . Sexual Activity: Not Currently   Other Topics Concern  . Not on file   Social History Narrative   Uses seat belt   Worked at [employer], Retired   Regular exercise: yes, [facility] and [facility]   Diet, no restrictions, minimizes dairy    Family History  Problem Relation Age of Onset  . Kidney failure Mother   . Hypertension Mother   . Stroke Mother   . Arthritis Mother     RA  . Cancer Father     bladder cancer  . Atrial fibrillation  Brother   . Sleep apnea Brother   . Heart attack Neg Hx   . Hypertension Brother   . Arthritis Brother   . Thyroid disease Brother     thyroid growth  . Breast cancer Sister   . Scleroderma Daughter     ROS: no fevers or chills, productive cough, hemoptysis, dysphasia, odynophagia, melena, hematochezia, dysuria, hematuria, rash, seizure activity, orthopnea, PND, pedal edema, claudication. Remaining systems are negative.  Physical Exam: Well-developed well-nourished in no acute distress.  Skin is warm and dry.  HEENT is normal.  Neck is supple.  Chest is clear to auscultation with normal expansion.  Cardiovascular exam is regular rate and rhythm.  Abdominal exam nontender or distended. No masses palpated. Extremities show no edema. neuro grossly intact  ECG 06/29/2015-sinus bradycardia with first-degree AV block.

## 2015-08-23 ENCOUNTER — Ambulatory Visit (INDEPENDENT_AMBULATORY_CARE_PROVIDER_SITE_OTHER): Payer: Medicare Other | Admitting: Cardiology

## 2015-08-23 ENCOUNTER — Encounter: Payer: Self-pay | Admitting: Cardiology

## 2015-08-23 VITALS — BP 124/72 | HR 54 | Ht 65.0 in | Wt 182.0 lb

## 2015-08-23 DIAGNOSIS — I10 Essential (primary) hypertension: Secondary | ICD-10-CM

## 2015-08-23 DIAGNOSIS — I4891 Unspecified atrial fibrillation: Secondary | ICD-10-CM | POA: Diagnosis not present

## 2015-08-23 DIAGNOSIS — I779 Disorder of arteries and arterioles, unspecified: Secondary | ICD-10-CM

## 2015-08-23 DIAGNOSIS — I739 Peripheral vascular disease, unspecified: Secondary | ICD-10-CM

## 2015-08-23 DIAGNOSIS — Z803 Family history of malignant neoplasm of breast: Secondary | ICD-10-CM | POA: Diagnosis not present

## 2015-08-23 DIAGNOSIS — N63 Unspecified lump in breast: Secondary | ICD-10-CM | POA: Diagnosis not present

## 2015-08-23 LAB — HM MAMMOGRAPHY

## 2015-08-23 NOTE — Assessment & Plan Note (Signed)
Blood pressure controlled. Continue present medications. 

## 2015-08-23 NOTE — Patient Instructions (Signed)

## 2015-08-23 NOTE — Assessment & Plan Note (Addendum)
Patient remains in sinus rhythm. Continue metoprolol and flecainide. Continue xarelto. She will need lifelong anticoagulation given history of CVA. Note approximately 20 minutes spent reviewing records prior to patient arrival.

## 2015-08-23 NOTE — Assessment & Plan Note (Signed)
Continue statin.No aspirin given need for anticoagulation. 

## 2015-08-23 NOTE — Assessment & Plan Note (Signed)
Continue statin. 

## 2015-08-24 LAB — BASIC METABOLIC PANEL
BUN: 13 mg/dL (ref 7–25)
CHLORIDE: 104 mmol/L (ref 98–110)
CO2: 26 mmol/L (ref 20–31)
Calcium: 9 mg/dL (ref 8.6–10.4)
Creat: 0.95 mg/dL — ABNORMAL HIGH (ref 0.60–0.93)
Glucose, Bld: 85 mg/dL (ref 65–99)
POTASSIUM: 4.1 mmol/L (ref 3.5–5.3)
SODIUM: 142 mmol/L (ref 135–146)

## 2015-08-25 ENCOUNTER — Encounter: Payer: Self-pay | Admitting: Family Medicine

## 2015-08-26 ENCOUNTER — Ambulatory Visit: Payer: Medicare Other | Admitting: Cardiology

## 2015-09-04 NOTE — Assessment & Plan Note (Signed)
Well treated on Flecainide

## 2015-09-04 NOTE — Progress Notes (Signed)
Patient ID: Autumn Moss, female   DOB: [birth year], 75 y.o.   MRN: [id number]   Subjective:    Patient ID: Autumn Moss, female    DOB: 11-30-1940, 75 y.o.   MRN: [id number]  Chief Complaint  Patient presents with  . Follow-up    HPI Patient is in today for follow up. She is doing well. No recent illness or acute concerns. She continues to abstain from alcohol since October 2015. Continues to follow with [facility] and is doing well. Atrial fibrillation is well controlled. Denies CP/palp/SOB/HA/congestion/fevers/GI or GU c/o. Taking meds as prescribed  Past Medical History  Diagnosis Date  . Hypertension   . Paroxysmal atrial fibrillation (HCC)   . Palpitations     sporadic  . Hyperlipidemia   . Aortic valve disease     mild - 07/05/05 per echocardiogram   . Left ventricular diastolic dysfunction     mild - 07/05/05 per echocardiogram  . COPD (chronic obstructive pulmonary disease) ([place])   . Ectopic pregnancy     history of x2  . Dyspepsia     occasional  . History of echocardiogram 01/26/2009    Est. EF of 55-60%  --  MILD MITRAL REGURGITATION  -  SINCE LAST ECHO OF 07/10/05 NO SIGNIFICANT CHANGES.  ---  Joyice Faster Brackbill, MD  . Sinusitis acute 04/10/2011  . 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 Chronic anticoagulation     short course of Xarelto  . TIA (transient ischemic attack) 01/2012    back on Xarelto  . Bradycardia, sinus   . Cataract 06/28/2013    b/l  . Macular degeneration 06/28/2013  . Medicare annual wellness visit, subsequent 12/27/2013  . Benign essential HTN 04/26/2014  . Low back pain 04/26/2014  . Anemia 02/14/2015    Past Surgical History  Procedure Laterality Date  . Ectopic pregnancy surgery      bilateral, both tubes excised and 1 ovary excised  . Oophorectomy    . Appendectomy      Family History  Problem Relation Age of Onset  . Kidney failure Mother   . Hypertension Mother   . Stroke Mother   . Arthritis Mother     RA  . Cancer  Father     bladder cancer  . Atrial fibrillation Brother   . Sleep apnea Brother   . Heart attack Neg Hx   . Hypertension Brother   . Arthritis Brother   . Thyroid disease Brother     thyroid growth  . Breast cancer Sister   . Scleroderma Daughter     Social History   Social History  . Marital Status: Married    Spouse Name: Josph Macho  . Number of Children: 1  . Years of Education: N/A   Occupational History  .     Social History Main Topics  . Smoking status: Former Smoker -- 0.50 packs/day for 25 years    Types: Cigarettes    Quit date: 06/22/2005  . Smokeless tobacco: Never Used  . Alcohol Use: No  . Drug Use: No  . Sexual Activity: Not Currently   Other Topics Concern  . Not on file   Social History Narrative   Uses seat belt   Worked at [employer], Retired   Regular exercise: yes, [facility] and walks   Diet, no restrictions, minimizes dairy    Outpatient Prescriptions Prior to Visit  Medication Sig Dispense Refill  . amLODipine (NORVASC) 5 MG tablet Take 1 tablet (5  mg total) by mouth daily. 30 tablet 5  . Cholecalciferol (VITAMIN D-3) 1000 UNITS CAPS Take 1 capsule by mouth daily.     . flecainide (TAMBOCOR) 50 MG tablet Take 50 mg by mouth. Take 1 tablet by mouth daily in the morning and take 2 tablets by mouth daily in the evening    . Lutein 20 MG CAPS Take 1 capsule by mouth daily.    . metoprolol succinate (TOPROL-XL) 25 MG 24 hr tablet Take 0.5 tablets (12.5 mg total) by mouth 2 (two) times daily. 90 tablet 3  . multivitamin (THERAGRAN) per tablet Take 1 tablet by mouth daily.      Marland Kitchen omeprazole (PRILOSEC) 20 MG capsule Take 1 capsule (20 mg total) by mouth 2 (two) times daily. 180 capsule 3  . Probiotic Product (PROBIOTIC DAILY PO) Take 1 capsule by mouth daily.     . rivaroxaban (XARELTO) 20 MG TABS tablet TAKE 1 TABLET BY MOUTH DAILY WITH SUPPER 30 tablet 11  . simvastatin (ZOCOR) 20 MG tablet Take 1 tablet (20 mg total) by mouth daily. 90 tablet 3    No facility-administered medications prior to visit.    Allergies  Allergen Reactions  . Losartan     Ineffective, fatigue, racing heart, sob    Review of Systems  Constitutional: Negative for fever and malaise/fatigue.  HENT: Negative for congestion.   Eyes: Negative for blurred vision.  Respiratory: Negative for shortness of breath.   Cardiovascular: Negative for chest pain, palpitations and leg swelling.  Gastrointestinal: Negative for nausea, abdominal pain and blood in stool.  Genitourinary: Negative for dysuria and frequency.  Musculoskeletal: Negative for falls.  Skin: Negative for rash.  Neurological: Negative for dizziness, loss of consciousness and headaches.  Endo/Heme/Allergies: Negative for environmental allergies.  Psychiatric/Behavioral: Negative for depression. The patient is nervous/anxious.        Objective:    Physical Exam  Constitutional: She is oriented to person, place, and time. She appears well-developed and well-nourished. No distress.  HENT:  Head: Normocephalic and atraumatic.  Nose: Nose normal.  Eyes: Right eye exhibits no discharge. Left eye exhibits no discharge.  Neck: Normal range of motion. Neck supple.  Cardiovascular: Normal rate and regular rhythm.   No murmur heard. Pulmonary/Chest: Effort normal and breath sounds normal.  Abdominal: Soft. Bowel sounds are normal. There is no tenderness.  Musculoskeletal: She exhibits no edema.  Neurological: She is alert and oriented to person, place, and time.  Skin: Skin is warm and dry.  Psychiatric: She has a normal mood and affect.  Nursing note and vitals reviewed.   BP 140/60 mmHg  Pulse 48  Temp(Src) 97.8 F (36.6 C) (Oral)  Ht 5\' 5"  (1.651 m)  Wt 187 lb 6.4 oz (85.004 kg)  BMI 31.18 kg/m2  SpO2 98% Wt Readings from Last 3 Encounters:  08/23/15 182 lb (82.555 kg)  08/22/15 187 lb 6.4 oz (85.004 kg)  06/29/15 184 lb (83.462 kg)     Lab Results  Component Value Date   WBC  7.0 08/22/2015   HGB 11.2* 08/22/2015   HCT 34.6* 08/22/2015   PLT 336.0 08/22/2015   GLUCOSE 85 08/23/2015   CHOL 139 05/12/2015   TRIG 50 05/12/2015   HDL 79 05/12/2015   LDLDIRECT 156.6 10/17/2010   LDLCALC 50 05/12/2015   ALT 13 05/12/2015   AST 19 05/12/2015   NA 142 08/23/2015   K 4.1 08/23/2015   CL 104 08/23/2015   CREATININE 0.95* 08/23/2015   BUN  13 08/23/2015   CO2 26 08/23/2015   TSH 3.61 08/22/2015    Lab Results  Component Value Date   TSH 3.61 08/22/2015   Lab Results  Component Value Date   WBC 7.0 08/22/2015   HGB 11.2* 08/22/2015   HCT 34.6* 08/22/2015   MCV 89.8 08/22/2015   PLT 336.0 08/22/2015   Lab Results  Component Value Date   NA 142 08/23/2015   K 4.1 08/23/2015   CO2 26 08/23/2015   GLUCOSE 85 08/23/2015   BUN 13 08/23/2015   CREATININE 0.95* 08/23/2015   BILITOT 0.6 05/12/2015   ALKPHOS 81 05/12/2015   AST 19 05/12/2015   ALT 13 05/12/2015   PROT 6.7 05/12/2015   ALBUMIN 3.5* 05/12/2015   CALCIUM 9.0 08/23/2015   GFR 66.29 02/01/2015   Lab Results  Component Value Date   CHOL 139 05/12/2015   Lab Results  Component Value Date   HDL 79 05/12/2015   Lab Results  Component Value Date   LDLCALC 50 05/12/2015   Lab Results  Component Value Date   TRIG 50 05/12/2015   Lab Results  Component Value Date   CHOLHDL 1.8 05/12/2015   No results found for: HGBA1C     Assessment & Plan:   Problem List Items Addressed This Visit    Anemia    Repeat CBC today      Relevant Orders   CBC (Completed)   Atrial fibrillation ([place])    Well treated on Flecainide      Benign essential HTN    Well controlled, no changes to meds. Encouraged heart healthy diet such as the DASH diet and exercise as tolerated.       Relevant Orders   TSH (Completed)   CBC (Completed)   Mixed hyperlipidemia    Encouraged heart healthy diet, increase exercise, avoid trans fats, consider a krill oil cap daily      Overweight    Encouraged  DASH diet, decrease po intake and increase exercise as tolerated. Needs 7-8 hours of sleep nightly. Avoid trans fats, eat small, frequent meals every 4-5 hours with lean proteins, complex carbs and healthy fats. Minimize simple carbs       Other Visit Diagnoses    Need for vaccination with 13-polyvalent pneumococcal conjugate vaccine    -  Primary    Relevant Orders    Pneumococcal conjugate vaccine 13-valent (Completed)       I am having Ms. Kaestner maintain her multivitamin, Lutein, Vitamin D-3, Probiotic Product (PROBIOTIC DAILY PO), metoprolol succinate, omeprazole, simvastatin, rivaroxaban, flecainide, and amLODipine.  No orders of the defined types were placed in this encounter.     Penni Homans, MD

## 2015-09-04 NOTE — Assessment & Plan Note (Signed)
Encouraged heart healthy diet, increase exercise, avoid trans fats, consider a krill oil cap daily 

## 2015-09-04 NOTE — Assessment & Plan Note (Signed)
Encouraged DASH diet, decrease po intake and increase exercise as tolerated. Needs 7-8 hours of sleep nightly. Avoid trans fats, eat small, frequent meals every 4-5 hours with lean proteins, complex carbs and healthy fats. Minimize simple carbs 

## 2015-09-22 ENCOUNTER — Encounter: Payer: Self-pay | Admitting: Family Medicine

## 2016-01-23 ENCOUNTER — Other Ambulatory Visit: Payer: Self-pay | Admitting: Nurse Practitioner

## 2016-01-23 ENCOUNTER — Other Ambulatory Visit: Payer: Self-pay | Admitting: *Deleted

## 2016-01-23 MED ORDER — RIVAROXABAN 20 MG PO TABS
ORAL_TABLET | ORAL | 1 refills | Status: DC
Start: 2016-01-23 — End: 2016-05-08

## 2016-01-24 ENCOUNTER — Other Ambulatory Visit: Payer: Self-pay | Admitting: Nurse Practitioner

## 2016-01-24 NOTE — Telephone Encounter (Signed)
rivaroxaban (XARELTO) 20 MG TABS tablet  Medication  Date: 01/23/2016 Department: [address] Office Ordering/Authorizing: Lelon Perla, MD  Order Providers   Prescribing Provider Encounter Provider  Lelon Perla, MD Roberts Gaudy, CMA  Medication Detail    Disp Refills Start End   rivaroxaban (XARELTO) 20 MG TABS tablet 90 tablet 1 01/23/2016    Sig: TAKE 1 TABLET BY MOUTH DAILY WITH SUPPER   E-Prescribing Status: Receipt confirmed by pharmacy (01/23/2016 4:22 PM EDT)   Pharmacy   [id number] - [place], [place] - [address]

## 2016-02-07 ENCOUNTER — Other Ambulatory Visit: Payer: Self-pay | Admitting: Nurse Practitioner

## 2016-02-09 NOTE — Progress Notes (Signed)
HPI: Follow-up atrial fibrillation. Patient has a history of paroxysmal atrial fibrillation and is now controlled with flecainide. She has had previous TIA. Carotid Doppler 02/16/15 showed less than 39% stenosis bilaterally. Last echocardiogram February 2017 showed normal LV function, restrictive filling, mild aortic insufficiency, mild mitral regurgitation, moderate left atrial enlargement and mild to moderate tricuspid regurgitation. Monitor February 2017 showed sinus bradycardia with PACs and paroxysmal atrial fibrillation. Since last seen, the patient denies any dyspnea on exertion, orthopnea, PND, pedal edema, palpitations, syncope or chest pain.   Current Outpatient Prescriptions  Medication Sig Dispense Refill  . Cholecalciferol (VITAMIN D-3) 1000 UNITS CAPS Take 1 capsule by mouth daily.     . flecainide (TAMBOCOR) 50 MG tablet Take 50 mg by mouth. Take 1 tablet by mouth daily in the morning and take 2 tablets by mouth daily in the evening    . Lutein 20 MG CAPS Take 1 capsule by mouth daily.    . metoprolol succinate (TOPROL-XL) 25 MG 24 hr tablet Take 0.5 tablets (12.5 mg total) by mouth 2 (two) times daily. 90 tablet 3  . multivitamin (THERAGRAN) per tablet Take 1 tablet by mouth daily.      Marland Kitchen omeprazole (PRILOSEC) 20 MG capsule TAKE 1 CAPSULE TWICE DAILY 180 capsule 2  . Probiotic Product (PROBIOTIC DAILY PO) Take 1 capsule by mouth daily.     . rivaroxaban (XARELTO) 20 MG TABS tablet TAKE 1 TABLET BY MOUTH DAILY WITH SUPPER 90 tablet 1  . simvastatin (ZOCOR) 20 MG tablet Take 1 tablet (20 mg total) by mouth daily. 90 tablet 3   No current facility-administered medications for this visit.      Past Medical History:  Diagnosis Date  . Anemia 02/14/2015  . Aortic valve disease    mild - 07/05/05 per echocardiogram   . Benign essential HTN 04/26/2014  . Bradycardia, sinus   . Cataract 06/28/2013   b/l  . Chronic anticoagulation    short course of Xarelto  . COPD (chronic  obstructive pulmonary disease) ([place])   . Dyspepsia    occasional  . Ectopic pregnancy    history of x2  . History of echocardiogram 01/26/2009   Est. EF of 55-60%  --  MILD MITRAL REGURGITATION  -  SINCE LAST ECHO OF 07/10/05 NO SIGNIFICANT CHANGES.  ---  [name], MD  . Hyperlipidemia   . Hypertension   . Left ventricular diastolic dysfunction    mild - 07/05/05 per echocardiogram  . Low back pain 04/26/2014  . Macular degeneration 06/28/2013  . Medicare annual wellness visit, subsequent 12/27/2013  . 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 Palpitations    sporadic  . Paroxysmal atrial fibrillation (HCC)   . Sinusitis acute 04/10/2011  . TIA (transient ischemic attack) 01/2012   back on Xarelto    Past Surgical History:  Procedure Laterality Date  . APPENDECTOMY    . ECTOPIC PREGNANCY SURGERY     bilateral, both tubes excised and 1 ovary excised  . OOPHORECTOMY      Social History   Social History  . Marital status: Married    Spouse name: Josph Macho  . Number of children: 1  . Years of education: N/A   Occupational History  .  Retired   Social History Main Topics  . Smoking status: Former Smoker    Packs/day: 0.50    Years: 25.00    Types: Cigarettes    Quit date: 06/22/2005  . Smokeless tobacco: Never Used  .  Alcohol use No  . Drug use: No  . Sexual activity: Not Currently   Other Topics Concern  . Not on file   Social History Narrative   Uses seat belt   Worked at [employer], Retired   Regular exercise: yes, goes to [employer] daily and walks   Diet, no restrictions, minimizes dairy    Family History  Problem Relation Age of Onset  . Kidney failure Mother   . Hypertension Mother   . Stroke Mother   . Arthritis Mother     RA  . Cancer Father     bladder cancer  . Hypertension Brother   . Arthritis Brother   . Thyroid disease Brother     thyroid growth  . Breast cancer Sister   . Scleroderma Daughter   . Atrial fibrillation Brother   . Sleep apnea Brother   .  Heart attack Neg Hx     ROS: no fevers or chills, productive cough, hemoptysis, dysphasia, odynophagia, melena, hematochezia, dysuria, hematuria, rash, seizure activity, orthopnea, PND, pedal edema, claudication. Remaining systems are negative.  Physical Exam: Well-developed well-nourished in no acute distress.  Skin is warm and dry.  HEENT is normal.  Neck is supple.  Chest is clear to auscultation with normal expansion.  Cardiovascular exam is regular rate and rhythm.  Abdominal exam nontender or distended. No masses palpated. Extremities show no edema. neuro grossly intact  ECG-marked sinus bradycardia at a rate of 46. First-degree AV block. Nonspecific ST changes.  A/P  1 atrial fibrillation-patient remains in sinus rhythm. Continue metoprolol for rate control if atrial fibrillation recurs (decrease to 12.5 q HS given bradycardia). Continue flecainide. Continue xarelto. Check hemoglobin and renal function.  2 hyperlipidemia-continue statin. Check lipids and liver. I will also check hemoglobin A1c.   3 hypertension-blood pressure is mildly elevated but she follows this at home and it is typically controlled. Continue present medications.  4 carotid artery disease-continue statin. No aspirin given need for anticoagulation.  Kirk Ruths, MD

## 2016-02-11 ENCOUNTER — Other Ambulatory Visit: Payer: Self-pay | Admitting: Nurse Practitioner

## 2016-02-11 DIAGNOSIS — I491 Atrial premature depolarization: Secondary | ICD-10-CM

## 2016-02-16 ENCOUNTER — Ambulatory Visit (INDEPENDENT_AMBULATORY_CARE_PROVIDER_SITE_OTHER): Payer: Medicare Other | Admitting: Cardiology

## 2016-02-16 ENCOUNTER — Encounter: Payer: Self-pay | Admitting: Cardiology

## 2016-02-16 VITALS — BP 170/70 | HR 46 | Ht 65.0 in | Wt 190.2 lb

## 2016-02-16 DIAGNOSIS — I10 Essential (primary) hypertension: Secondary | ICD-10-CM

## 2016-02-16 DIAGNOSIS — I480 Paroxysmal atrial fibrillation: Secondary | ICD-10-CM | POA: Diagnosis not present

## 2016-02-16 DIAGNOSIS — E782 Mixed hyperlipidemia: Secondary | ICD-10-CM

## 2016-02-16 DIAGNOSIS — Z79899 Other long term (current) drug therapy: Secondary | ICD-10-CM

## 2016-02-16 LAB — HEPATIC FUNCTION PANEL
ALK PHOS: 85 U/L (ref 33–130)
ALT: 11 U/L (ref 6–29)
AST: 22 U/L (ref 10–35)
Albumin: 3.5 g/dL — ABNORMAL LOW (ref 3.6–5.1)
BILIRUBIN DIRECT: 0.1 mg/dL (ref <=0.2)
BILIRUBIN INDIRECT: 0.3 mg/dL (ref 0.2–1.2)
BILIRUBIN TOTAL: 0.4 mg/dL (ref 0.2–1.2)
Total Protein: 6.3 g/dL (ref 6.1–8.1)

## 2016-02-16 LAB — CBC
HEMATOCRIT: 34.8 % — AB (ref 35.0–45.0)
HEMOGLOBIN: 10.6 g/dL — AB (ref 11.7–15.5)
MCH: 26.8 pg — ABNORMAL LOW (ref 27.0–33.0)
MCHC: 30.5 g/dL — ABNORMAL LOW (ref 32.0–36.0)
MCV: 87.9 fL (ref 80.0–100.0)
MPV: 9.1 fL (ref 7.5–12.5)
Platelets: 295 10*3/uL (ref 140–400)
RBC: 3.96 MIL/uL (ref 3.80–5.10)
RDW: 16.8 % — AB (ref 11.0–15.0)
WBC: 5.7 10*3/uL (ref 3.8–10.8)

## 2016-02-16 LAB — LIPID PANEL
Cholesterol: 144 mg/dL (ref 125–200)
HDL: 71 mg/dL (ref >=46)
LDL CALC: 64 mg/dL (ref <130)
Total CHOL/HDL Ratio: 2 Ratio (ref <=5.0)
Triglycerides: 46 mg/dL (ref <150)
VLDL: 9 mg/dL (ref <30)

## 2016-02-16 LAB — BASIC METABOLIC PANEL
BUN: 14 mg/dL (ref 7–25)
CHLORIDE: 107 mmol/L (ref 98–110)
CO2: 27 mmol/L (ref 20–31)
CREATININE: 0.93 mg/dL (ref 0.60–0.93)
Calcium: 8.8 mg/dL (ref 8.6–10.4)
GLUCOSE: 81 mg/dL (ref 65–99)
POTASSIUM: 4.8 mmol/L (ref 3.5–5.3)
Sodium: 141 mmol/L (ref 135–146)

## 2016-02-16 MED ORDER — METOPROLOL SUCCINATE ER 25 MG PO TB24: 12.5000 mg | ORAL_TABLET | Freq: Every day | ORAL | 3 refills | Status: DC

## 2016-02-16 NOTE — Patient Instructions (Addendum)
Your physician wants you to follow-up in:6 months or sooner if needed.  You will receive a reminder letter in the mail two months in advance. If you don't receive a letter, please call our office to schedule the follow-up appointment.  Your physician recommends that you return for lab work fasting.  Your physician has recommended you make the following change in your medication:   1.) the metoprolol has been changed to 1/2 tablet at night.  If you need a refill on your cardiac medications before your next appointment, please call your pharmacy.

## 2016-02-17 LAB — HEMOGLOBIN A1C
Hgb A1c MFr Bld: 5.6 % (ref <5.7)
MEAN PLASMA GLUCOSE: 114 mg/dL

## 2016-02-20 ENCOUNTER — Encounter: Payer: Medicare Other | Admitting: Family Medicine

## 2016-02-20 ENCOUNTER — Ambulatory Visit: Payer: Medicare Other | Admitting: *Deleted

## 2016-02-21 ENCOUNTER — Encounter: Payer: Self-pay | Admitting: Family Medicine

## 2016-02-21 DIAGNOSIS — N6310 Unspecified lump in the right breast, unspecified quadrant: Secondary | ICD-10-CM | POA: Diagnosis not present

## 2016-02-27 ENCOUNTER — Encounter: Payer: Self-pay | Admitting: Family Medicine

## 2016-03-19 ENCOUNTER — Telehealth: Payer: Self-pay | Admitting: Cardiology

## 2016-03-19 NOTE — Telephone Encounter (Signed)
1. xarelto 20mg , 1 X daily [facility]. [facility] [place] [facility]. Needs 3 more packs of samples/in donut hole

## 2016-03-20 NOTE — Telephone Encounter (Signed)
Call and let pt know we don't have xarelto samples

## 2016-04-05 ENCOUNTER — Telehealth: Payer: Self-pay | Admitting: Cardiology

## 2016-04-05 NOTE — Telephone Encounter (Signed)
New message  Pt is calling again for samples  1. x1. xarelto 20mg , 1 X daily 2. [facility] [place] [facility]. Needs 3 more packs of samples/in donut holearelto 20mg , 1 X daily 2. [facility] [place] [facility]. Needs 3 more packs of samples/in donut hole

## 2016-04-10 NOTE — Telephone Encounter (Signed)
Home # is invalid. Cell # mail box is full. No Xarelto samples available.

## 2016-05-08 ENCOUNTER — Telehealth: Payer: Self-pay | Admitting: *Deleted

## 2016-05-08 MED ORDER — SIMVASTATIN 20 MG PO TABS: 20.0000 mg | ORAL_TABLET | Freq: Every day | ORAL | 3 refills | Status: AC

## 2016-05-08 MED ORDER — OMEPRAZOLE 20 MG PO CPDR: 20.0000 mg | DELAYED_RELEASE_CAPSULE | Freq: Two times a day (BID) | ORAL | 3 refills | Status: AC

## 2016-05-08 MED ORDER — FLECAINIDE ACETATE 50 MG PO TABS: ORAL_TABLET | ORAL | 3 refills | Status: AC

## 2016-05-08 MED ORDER — RIVAROXABAN 20 MG PO TABS: ORAL_TABLET | ORAL | 11 refills | Status: DC

## 2016-05-08 MED ORDER — METOPROLOL SUCCINATE ER 25 MG PO TB24: 12.5000 mg | ORAL_TABLET | Freq: Every day | ORAL | 3 refills | Status: DC

## 2016-05-08 NOTE — Telephone Encounter (Signed)
Spoke with patient. She has moved to [facility] and needs refill to her local pharmacy. Rx(s) sent to pharmacy electronically.

## 2016-05-08 NOTE — Telephone Encounter (Signed)
Patient left a msg on the refill vm requesting a call back at [phone] to discuss the simvastatin and metoprolol as she stated that the pharmacy informed her that the rx's have been cancelled. Thanks, MI

## 2016-05-18 ENCOUNTER — Ambulatory Visit (INDEPENDENT_AMBULATORY_CARE_PROVIDER_SITE_OTHER): Payer: Medicare Other | Admitting: Family Medicine

## 2016-05-18 ENCOUNTER — Encounter: Payer: Self-pay | Admitting: Family Medicine

## 2016-05-18 DIAGNOSIS — D509 Iron deficiency anemia, unspecified: Secondary | ICD-10-CM | POA: Diagnosis not present

## 2016-05-18 DIAGNOSIS — E782 Mixed hyperlipidemia: Secondary | ICD-10-CM | POA: Diagnosis not present

## 2016-05-18 DIAGNOSIS — Z0000 Encounter for general adult medical examination without abnormal findings: Secondary | ICD-10-CM | POA: Diagnosis not present

## 2016-05-18 DIAGNOSIS — I480 Paroxysmal atrial fibrillation: Secondary | ICD-10-CM

## 2016-05-18 DIAGNOSIS — M25552 Pain in left hip: Secondary | ICD-10-CM

## 2016-05-18 DIAGNOSIS — I10 Essential (primary) hypertension: Secondary | ICD-10-CM

## 2016-05-18 HISTORY — DX: Pain in left hip: M25.552

## 2016-05-18 LAB — COMPREHENSIVE METABOLIC PANEL
ALT: 11 U/L (ref 0–35)
AST: 19 U/L (ref 0–37)
Albumin: 3.7 g/dL (ref 3.5–5.2)
Alkaline Phosphatase: 93 U/L (ref 39–117)
BILIRUBIN TOTAL: 0.4 mg/dL (ref 0.2–1.2)
BUN: 14 mg/dL (ref 6–23)
CHLORIDE: 105 meq/L (ref 96–112)
CO2: 29 meq/L (ref 19–32)
Calcium: 9 mg/dL (ref 8.4–10.5)
Creatinine, Ser: 0.92 mg/dL (ref 0.40–1.20)
GFR: 62.76 mL/min (ref >60.00)
Glucose, Bld: 80 mg/dL (ref 70–99)
POTASSIUM: 3.7 meq/L (ref 3.5–5.1)
Sodium: 141 mEq/L (ref 135–145)
Total Protein: 6.7 g/dL (ref 6.0–8.3)

## 2016-05-18 NOTE — Progress Notes (Signed)
Subjective:    Patient ID: Autumn Moss, female    DOB: 1940/10/26, 76 y.o.   MRN: [id number]  Chief Complaint  Patient presents with  . Annual Exam    HPI Patient is in today for annual exam. Is doing well but does have ongoing pain in left hip. She denies any recent trauma or incontinence. She is noting no radiculopathy. She otherwise reports doing well, is following with cardiology and offers no complaints of chest pain, palp, sob. Is doing well with ADLs and is trying to maintain a heart healthy diet. Denies CP/palp/SOB/HA/congestion/fevers/GI or GU c/o. Taking meds as prescribed  Past Medical History:  Diagnosis Date  . Anemia 02/14/2015  . Aortic valve disease    mild - 07/05/05 per echocardiogram   . Benign essential HTN 04/26/2014  . Bradycardia, sinus   . Cataract 06/28/2013   b/l  . Chronic anticoagulation    short course of Xarelto  . COPD (chronic obstructive pulmonary disease) ([place])   . Dyspepsia    occasional  . Ectopic pregnancy    history of x2  . History of echocardiogram 01/26/2009   Est. EF of 55-60%  --  MILD MITRAL REGURGITATION  -  SINCE LAST ECHO OF 07/10/05 NO SIGNIFICANT CHANGES.  ---  [name], MD  . Hyperlipidemia   . Hypertension   . Left hip pain 05/18/2016  . Left ventricular diastolic dysfunction    mild - 07/05/05 per echocardiogram  . Low back pain 04/26/2014  . Macular degeneration 06/28/2013  . Medicare annual wellness visit, subsequent 12/27/2013  . 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 Palpitations    sporadic  . Paroxysmal atrial fibrillation (HCC)   . Preventative health care 05/27/2016  . Sinusitis acute 04/10/2011  . TIA (transient ischemic attack) 01/2012   back on Xarelto    Past Surgical History:  Procedure Laterality Date  . APPENDECTOMY    . ECTOPIC PREGNANCY SURGERY     bilateral, both tubes excised and 1 ovary excised  . OOPHORECTOMY      Family History  Problem Relation Age of Onset  . Kidney failure Mother     . Hypertension Mother   . Stroke Mother   . Arthritis Mother     RA  . Cancer Father     bladder cancer  . Hypertension Brother   . Arthritis Brother   . Thyroid disease Brother     thyroid growth  . Breast cancer Sister   . Scleroderma Daughter   . Sleep apnea Brother   . Heart attack Neg Hx     Social History   Social History  . Marital status: Married    Spouse name: Josph Macho  . Number of children: 1  . Years of education: N/A   Occupational History  .  Retired   Social History Main Topics  . Smoking status: Former Smoker    Packs/day: 0.50    Years: 25.00    Types: Cigarettes    Quit date: 06/22/2005  . Smokeless tobacco: Never Used  . Alcohol use No  . Drug use: No  . Sexual activity: Not Currently   Other Topics Concern  . Not on file   Social History Narrative   Uses seat belt   Worked at [employer], Retired, moved to [employer] [place] [address]   Regular exercise: yes, [facility] and walks   Diet, no restrictions, minimizes dairy    Outpatient Medications Prior to Visit  Medication  Sig Dispense Refill  . Cholecalciferol (VITAMIN D-3) 1000 UNITS CAPS Take 1 capsule by mouth daily.     . flecainide (TAMBOCOR) 50 MG tablet Take 1 tablet by mouth daily in the morning and take 2 tablets by mouth daily in the evening 270 tablet 3  . Lutein 20 MG CAPS Take 1 capsule by mouth daily.    . metoprolol succinate (TOPROL-XL) 25 MG 24 hr tablet Take 0.5 tablets (12.5 mg total) by mouth at bedtime. 45 tablet 3  . multivitamin (THERAGRAN) per tablet Take 1 tablet by mouth daily.      Marland Kitchen omeprazole (PRILOSEC) 20 MG capsule Take 1 capsule (20 mg total) by mouth 2 (two) times daily. 180 capsule 3  . Probiotic Product (PROBIOTIC DAILY PO) Take 1 capsule by mouth daily.     . rivaroxaban (XARELTO) 20 MG TABS tablet TAKE 1 TABLET BY MOUTH DAILY WITH SUPPER 30 tablet 11  . simvastatin (ZOCOR) 20 MG tablet Take 1 tablet (20 mg total) by mouth daily. 90 tablet 3   No  facility-administered medications prior to visit.     Allergies  Allergen Reactions  . Losartan     Ineffective, fatigue, racing heart, sob    Review of Systems  Constitutional: Negative for fever and malaise/fatigue.  HENT: Negative for congestion.   Eyes: Negative for blurred vision.  Respiratory: Negative for cough and shortness of breath.   Cardiovascular: Negative for chest pain, palpitations and leg swelling.  Gastrointestinal: Negative for melena and vomiting.  Musculoskeletal: Positive for joint pain. Negative for back pain.  Skin: Negative for rash.  Neurological: Negative for loss of consciousness and headaches.       Objective:    Physical Exam  Constitutional: She is oriented to person, place, and time. She appears well-developed and well-nourished. No distress.  HENT:  Head: Normocephalic and atraumatic.  Eyes: Conjunctivae are normal.  Neck: Normal range of motion. No thyromegaly present.  Cardiovascular: Normal rate and regular rhythm.   Murmur heard. Pulmonary/Chest: Effort normal and breath sounds normal. She has no wheezes.  Abdominal: Soft. Bowel sounds are normal. There is no tenderness. There is no rebound and no guarding.  Musculoskeletal: Normal range of motion. She exhibits no edema or deformity.  Neurological: She is alert and oriented to person, place, and time.  Skin: Skin is warm and dry. She is not diaphoretic.  Psychiatric: She has a normal mood and affect.    BP 140/90   Pulse (!) 58   Temp 98.4 F (36.9 C) (Oral)   Wt 187 lb 6.4 oz (85 kg)   SpO2 95%   BMI 31.18 kg/m  Wt Readings from Last 3 Encounters:  05/18/16 187 lb 6.4 oz (85 kg)  02/16/16 190 lb 3.2 oz (86.3 kg)  08/23/15 182 lb (82.6 kg)     Lab Results  Component Value Date   WBC 5.7 02/16/2016   HGB 10.6 (L) 02/16/2016   HCT 34.8 (L) 02/16/2016   PLT 295 02/16/2016   GLUCOSE 80 05/18/2016   CHOL 144 02/16/2016   TRIG 46 02/16/2016   HDL 71 02/16/2016   LDLDIRECT  156.6 10/17/2010   LDLCALC 64 02/16/2016   ALT 11 05/18/2016   AST 19 05/18/2016   NA 141 05/18/2016   K 3.7 05/18/2016   CL 105 05/18/2016   CREATININE 0.92 05/18/2016   BUN 14 05/18/2016   CO2 29 05/18/2016   TSH 3.61 08/22/2015   HGBA1C 5.6 02/16/2016    Lab Results  Component Value Date   TSH 3.61 08/22/2015   Lab Results  Component Value Date   WBC 5.7 02/16/2016   HGB 10.6 (L) 02/16/2016   HCT 34.8 (L) 02/16/2016   MCV 87.9 02/16/2016   PLT 295 02/16/2016   Lab Results  Component Value Date   NA 141 05/18/2016   K 3.7 05/18/2016   CO2 29 05/18/2016   GLUCOSE 80 05/18/2016   BUN 14 05/18/2016   CREATININE 0.92 05/18/2016   BILITOT 0.4 05/18/2016   ALKPHOS 93 05/18/2016   AST 19 05/18/2016   ALT 11 05/18/2016   PROT 6.7 05/18/2016   ALBUMIN 3.7 05/18/2016   CALCIUM 9.0 05/18/2016   GFR 62.76 05/18/2016   Lab Results  Component Value Date   CHOL 144 02/16/2016   Lab Results  Component Value Date   HDL 71 02/16/2016   Lab Results  Component Value Date   LDLCALC 64 02/16/2016   Lab Results  Component Value Date   TRIG 46 02/16/2016   Lab Results  Component Value Date   CHOLHDL 2.0 02/16/2016   Lab Results  Component Value Date   HGBA1C 5.6 02/16/2016      I acted as a Education administrator for Dr. Charlett Blake. Princess, RMA   Assessment & Plan:   Problem List Items Addressed This Visit    Mixed hyperlipidemia    Encouraged heart healthy diet, increase exercise, avoid trans fats, consider a krill oil cap daily      Atrial fibrillation (HCC)    Tolerating Xarelto, asymptomaticc      Benign essential HTN    Improved on recheck and feeling well will not make any changes today, is following with cardiology      Anemia    Increase leafy greens, consider increased lean red meat and using cast iron cookware. Continue to monitor, report any concerns, check cbc today      Relevant Orders   Comprehensive metabolic panel (Completed)   Left hip pain    Preventative health care     Patient encouraged to maintain heart healthy diet, regular exercise, adequate sleep. Consider daily probiotics. Take medications as prescribed.          I am having Ms. Ribble maintain her multivitamin, Lutein, Vitamin D-3, Probiotic Product (PROBIOTIC DAILY PO), simvastatin, metoprolol succinate, flecainide, omeprazole, and rivaroxaban.  No orders of the defined types were placed in this encounter.   CMA served as Education administrator during this visit. History, Physical and Plan performed by medical provider. Documentation and orders reviewed and attested to.  Penni Homans, MD

## 2016-05-18 NOTE — Assessment & Plan Note (Signed)
Tolerating Xarelto, asymptomaticc

## 2016-05-18 NOTE — Progress Notes (Signed)
Pre visit review using our clinic review tool, if applicable. No additional management support is needed unless otherwise documented below in the visit note. 

## 2016-05-18 NOTE — Assessment & Plan Note (Signed)
Encouraged heart healthy diet, increase exercise, avoid trans fats, consider a krill oil cap daily 

## 2016-05-18 NOTE — Assessment & Plan Note (Signed)
Increase leafy greens, consider increased lean red meat and using cast iron cookware. Continue to monitor, report any concerns, check cbc today

## 2016-05-18 NOTE — Patient Instructions (Signed)
Preventive Care 78 Years and Older, Female Preventive care refers to lifestyle choices and visits with your health care provider that can promote health and wellness. What does preventive care include?  A yearly physical exam. This is also called an annual well check.  Dental exams once or twice a year.  Routine eye exams. Ask your health care provider how often you should have your eyes checked.  Personal lifestyle choices, including:  Daily care of your teeth and gums.  Regular physical activity.  Eating a healthy diet.  Avoiding tobacco and drug use.  Limiting alcohol use.  Practicing safe sex.  Taking low-dose aspirin every day.  Taking vitamin and mineral supplements as recommended by your health care provider. What happens during an annual well check? The services and screenings done by your health care provider during your annual well check will depend on your age, overall health, lifestyle risk factors, and family history of disease. Counseling  Your health care provider may ask you questions about your:  Alcohol use.  Tobacco use.  Drug use.  Emotional well-being.  Home and relationship well-being.  Sexual activity.  Eating habits.  History of falls.  Memory and ability to understand (cognition).  Work and work environment.  Reproductive health. Screening  You may have the following tests or measurements:  Height, weight, and BMI.  Blood pressure.  Lipid and cholesterol levels. These may be checked every 5 years, or more frequently if you are over 50 years old.  Skin check.  Lung cancer screening. You may have this screening every year starting at age 55 if you have a 30-pack-year history of smoking and currently smoke or have quit within the past 15 years.  Fecal occult blood test (FOBT) of the stool. You may have this test every year starting at age 50.  Flexible sigmoidoscopy or colonoscopy. You may have a sigmoidoscopy every 5 years or  a colonoscopy every 10 years starting at age 50.  Hepatitis C blood test.  Hepatitis B blood test.  Sexually transmitted disease (STD) testing.  Diabetes screening. This is done by checking your blood sugar (glucose) after you have not eaten for a while (fasting). You may have this done every 1-3 years.  Bone density scan. This is done to screen for osteoporosis. You may have this done starting at age 78.  Mammogram. This may be done every 1-2 years. Talk to your health care provider about how often you should have regular mammograms. Talk with your health care provider about your test results, treatment options, and if necessary, the need for more tests. Vaccines  Your health care provider may recommend certain vaccines, such as:  Influenza vaccine. This is recommended every year.  Tetanus, diphtheria, and acellular pertussis (Tdap, Td) vaccine. You may need a Td booster every 10 years.  Varicella vaccine. You may need this if you have not been vaccinated.  Zoster vaccine. You may need this after age 78.  Measles, mumps, and rubella (MMR) vaccine. You may need at least one dose of MMR if you were born in 1957 or later. You may also need a second dose.  Pneumococcal 13-valent conjugate (PCV13) vaccine. One dose is recommended after age 78.  Pneumococcal polysaccharide (PPSV23) vaccine. One dose is recommended after age 78.  Meningococcal vaccine. You may need this if you have certain conditions.  Hepatitis A vaccine. You may need this if you have certain conditions or if you travel or work in places where you may be exposed to   hepatitis A.  Hepatitis B vaccine. You may need this if you have certain conditions or if you travel or work in places where you may be exposed to hepatitis B.  Haemophilus influenzae type b (Hib) vaccine. You may need this if you have certain conditions. Talk to your health care provider about which screenings and vaccines you need and how often you need  them. This information is not intended to replace advice given to you by your health care provider. Make sure you discuss any questions you have with your health care provider. Document Released: 05/20/2015 Document Revised: 01/11/2016 Document Reviewed: 02/22/2015 Elsevier Interactive Patient Education  2017 Elsevier Inc.  

## 2016-05-27 ENCOUNTER — Encounter: Payer: Self-pay | Admitting: Family Medicine

## 2016-05-27 DIAGNOSIS — Z0000 Encounter for general adult medical examination without abnormal findings: Secondary | ICD-10-CM

## 2016-05-27 HISTORY — DX: Encounter for general adult medical examination without abnormal findings: Z00.00

## 2016-05-27 NOTE — Assessment & Plan Note (Signed)
Patient encouraged to maintain heart healthy diet, regular exercise, adequate sleep. Consider daily probiotics. Take medications as prescribed 

## 2016-05-27 NOTE — Assessment & Plan Note (Signed)
Improved on recheck and feeling well will not make any changes today, is following with cardiology

## 2016-08-22 LAB — HM MAMMOGRAPHY

## 2016-08-23 ENCOUNTER — Encounter: Payer: Self-pay | Admitting: Physician Assistant

## 2016-08-23 ENCOUNTER — Ambulatory Visit (INDEPENDENT_AMBULATORY_CARE_PROVIDER_SITE_OTHER): Payer: Medicare Other | Admitting: Physician Assistant

## 2016-08-23 VITALS — BP 144/76 | HR 49 | Ht 65.0 in | Wt 191.6 lb

## 2016-08-23 DIAGNOSIS — I480 Paroxysmal atrial fibrillation: Secondary | ICD-10-CM | POA: Diagnosis not present

## 2016-08-23 DIAGNOSIS — I10 Essential (primary) hypertension: Secondary | ICD-10-CM | POA: Diagnosis not present

## 2016-08-23 DIAGNOSIS — Z7901 Long term (current) use of anticoagulants: Secondary | ICD-10-CM | POA: Diagnosis not present

## 2016-08-23 DIAGNOSIS — E782 Mixed hyperlipidemia: Secondary | ICD-10-CM

## 2016-08-23 DIAGNOSIS — R011 Cardiac murmur, unspecified: Secondary | ICD-10-CM

## 2016-08-23 LAB — COMPREHENSIVE METABOLIC PANEL
ALBUMIN: 3.7 g/dL (ref 3.6–5.1)
ALT: 11 U/L (ref 6–29)
AST: 17 U/L (ref 10–35)
Alkaline Phosphatase: 87 U/L (ref 33–130)
BUN: 17 mg/dL (ref 7–25)
CALCIUM: 9 mg/dL (ref 8.6–10.4)
CHLORIDE: 107 mmol/L (ref 98–110)
CO2: 27 mmol/L (ref 20–31)
CREATININE: 0.95 mg/dL — AB (ref 0.60–0.93)
Glucose, Bld: 85 mg/dL (ref 65–99)
Potassium: 4.4 mmol/L (ref 3.5–5.3)
SODIUM: 142 mmol/L (ref 135–146)
TOTAL PROTEIN: 6.4 g/dL (ref 6.1–8.1)
Total Bilirubin: 0.4 mg/dL (ref 0.2–1.2)

## 2016-08-23 LAB — LIPID PANEL
Cholesterol: 162 mg/dL (ref <200)
HDL: 75 mg/dL (ref >50)
LDL CALC: 75 mg/dL (ref <100)
TRIGLYCERIDES: 60 mg/dL (ref <150)
Total CHOL/HDL Ratio: 2.2 Ratio (ref <5.0)
VLDL: 12 mg/dL (ref <30)

## 2016-08-23 LAB — CBC
HEMATOCRIT: 35 % (ref 35.0–45.0)
HEMOGLOBIN: 10.9 g/dL — AB (ref 11.7–15.5)
MCH: 27.8 pg (ref 27.0–33.0)
MCHC: 31.1 g/dL — ABNORMAL LOW (ref 32.0–36.0)
MCV: 89.3 fL (ref 80.0–100.0)
MPV: 9.6 fL (ref 7.5–12.5)
Platelets: 361 10*3/uL (ref 140–400)
RBC: 3.92 MIL/uL (ref 3.80–5.10)
RDW: 16.2 % — AB (ref 11.0–15.0)
WBC: 5.9 10*3/uL (ref 3.8–10.8)

## 2016-08-23 NOTE — Patient Instructions (Signed)
Medication Instructions: no changes   Labwork: CBC, CMET, Lipids   Testing/Procedures: none   Follow-Up: with Dr. Stanford Breed in 6 months.  Continue to check your BPs at home daily, and bring these readings to your next appointment.  If you need a refill on your cardiac medications before your next appointment, please call your pharmacy.

## 2016-08-23 NOTE — Progress Notes (Signed)
Cardiology Office Note   Date:  08/23/2016   ID:  Autumn Moss, DOB 08/01/1940, MRN [id number]  PCP:  Penni Homans, MD  Cardiologist:  Dr. Stanford Breed, 02/16/2016  Rosaria Ferries, PA-C    History of Present Illness: Autumn Moss is a 76 y.o. 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 No afib, compliant with the flecainide and metoprolol. No palpitations, presyncope syncope.   Her BP was 118/68 at a recent Urgent Care visit. Her home SBP readings have been in the 120s or less. She thinks she may have "white coat syndrome.  She is exercising 4 days a week. They now live permanently in [facility], near [place]. For now, she is coming to [place] to visit all of her physicians, but may decide to switch them to [place].  She never gets chest pain or SOB with exertion. No LE edema since stopping the losartan.    Past Medical History:  Diagnosis Date  . Anemia 02/14/2015  . Aortic valve disease    mild - 07/05/05 per echocardiogram   . Benign essential HTN 04/26/2014  . Bradycardia, sinus   . Cataract 06/28/2013   b/l  . Chronic anticoagulation    short course of Xarelto  . COPD (chronic obstructive pulmonary disease) ([place])   . Dyspepsia    occasional  . Ectopic pregnancy    history of x2  . History of echocardiogram 01/26/2009   Est. EF of 55-60%  --  MILD MITRAL REGURGITATION  -  SINCE LAST ECHO OF 07/10/05 NO SIGNIFICANT CHANGES.  ---  Autumn Faster Mare Ferrari, MD  . Hyperlipidemia   . Hypertension   . Left hip pain 05/18/2016  . Left ventricular diastolic dysfunction    mild - 07/05/05 per echocardiogram  . Low back pain 04/26/2014  . Macular degeneration 06/28/2013  . Medicare annual wellness visit, subsequent 12/27/2013  . 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 Palpitations    sporadic  . Paroxysmal  atrial fibrillation (HCC)   . Preventative health care 05/27/2016  . Sinusitis acute 04/10/2011  . TIA (transient ischemic attack) 01/2012   back on Xarelto    Past Surgical History:  Procedure Laterality Date  . APPENDECTOMY    . ECTOPIC PREGNANCY SURGERY     bilateral, both tubes excised and 1 ovary excised  . OOPHORECTOMY      Current Outpatient Prescriptions  Medication Sig Dispense Refill  . Cholecalciferol (VITAMIN D-3) 1000 UNITS CAPS Take 1 capsule by mouth daily.     . flecainide (TAMBOCOR) 50 MG tablet Take 1 tablet by mouth daily in the morning and take 2 tablets by mouth daily in the evening 270 tablet 3  . Lutein 20 MG CAPS Take 1 capsule by mouth daily.    . metoprolol succinate (TOPROL-XL) 25 MG 24 hr tablet Take 0.5 tablets (12.5 mg total) by mouth at bedtime. 45 tablet 3  . multivitamin (THERAGRAN) per tablet Take 1 tablet by mouth daily.      Marland Kitchen omeprazole (PRILOSEC) 20 MG capsule Take 1 capsule (20 mg total) by mouth 2 (two) times daily. 180 capsule 3  . Probiotic Product (PROBIOTIC DAILY PO) Take 1 capsule by mouth daily.     . rivaroxaban (XARELTO) 20 MG TABS tablet TAKE 1 TABLET BY MOUTH DAILY WITH SUPPER 30 tablet 11  .  simvastatin (ZOCOR) 20 MG tablet Take 1 tablet (20 mg total) by mouth daily. 90 tablet 3   No current facility-administered medications for this visit.     Allergies:   Losartan    Social History:  The patient  reports that she quit smoking about 11 years ago. Her smoking use included Cigarettes. She has a 12.50 pack-year smoking history. She has never used smokeless tobacco. She reports that she does not drink alcohol or use drugs.   Family History:  The patient's family history includes Arthritis in her brother and mother; Breast cancer in her sister; Cancer in her father; Hypertension in her brother and mother; Kidney failure in her mother; Scleroderma in her daughter; Sleep apnea in her brother; Stroke in her mother; Thyroid disease in her  brother.    ROS:  Please see the history of present illness. All other systems are reviewed and negative.    PHYSICAL EXAM: VS:  BP (!) 160/66   Pulse (!) 49   Ht 5\' 5"  (1.651 m)   Wt 191 lb 9.6 oz (86.9 kg)   SpO2 98%   BMI 31.88 kg/m  , BMI Body mass index is 31.88 kg/m. GEN: Well nourished, well developed, female in no acute distress  HEENT: normal for age  Neck: no JVD, no carotid bruit, no masses Cardiac: RRR; 2/6 murmur, no rubs, or gallops Respiratory:  clear to auscultation bilaterally, normal work of breathing GI: soft, nontender, nondistended, + BS MS: no deformity or atrophy; no edema; distal pulses are 2+ in all 4 extremities   Skin: warm and dry, no rash Neuro:  Strength and sensation are intact Psych: euthymic mood, full affect   EKG:  EKG is not ordered today.   Recent Labs: 02/16/2016: Hemoglobin 10.6; Platelets 295 05/18/2016: ALT 11; BUN 14; Creatinine, Ser 0.92; Potassium 3.7; Sodium 141    Lipid Panel    Component Value Date/Time   CHOL 144 02/16/2016 1103   TRIG 46 02/16/2016 1103   HDL 71 02/16/2016 1103   CHOLHDL 2.0 02/16/2016 1103   VLDL 9 02/16/2016 1103   LDLCALC 64 02/16/2016 1103   LDLDIRECT 156.6 10/17/2010 0814     Wt Readings from Last 3 Encounters:  08/23/16 191 lb 9.6 oz (86.9 kg)  05/18/16 187 lb 6.4 oz (85 kg)  02/16/16 190 lb 3.2 oz (86.3 kg)     Other studies Reviewed: Additional studies/ records that were reviewed today include: Office notes, testing.  ASSESSMENT AND PLAN:  1.  HTN: per pt, her BP runs normal except when she is here. Encouraged her to keep an eye on it and let us know if > 130/80. Continue current rx for now.  2. Hyperlipidemia: continue statin, ck profile today  3. PAF: no sx and HR is regular. She is compliant w/ Rx. Continue flecainide  4. Chronic anticoagulation: she is compliant w/ Xarelto, no bleeding issues.  5. Heart murmur: I did not hear the AI, but heard the MR and TR. She does not need  an echo at this time, she is not having any shortness of breath or volume overload. Follow for symptoms.   Current medicines are reviewed at length with the patient today.  The patient does not have concerns regarding medicines.  The following changes have been made:  no change  Labs/ tests ordered today include:  No orders of the defined types were placed in this encounter.    Disposition:   FU with Dr Pennie Banter in 6 months, she  may switch to [place] in [facility] if they will live there permanently.   Augusto Garbe  08/23/2016 9:01 AM    [place] Phone: [phone]; Fax: [phone]  This note was written with the assistance of speech recognition software. Please excuse any transcriptional errors.

## 2016-08-30 ENCOUNTER — Encounter: Payer: Self-pay | Admitting: Family Medicine

## 2016-10-16 IMAGING — CR DG LUMBAR SPINE COMPLETE 4+V
5 series · 5 of 5 positions shown · non-contrast
Comparison: None.

CLINICAL DATA: Two week history of low back pain radiating into
left hip region

EXAM:
LUMBAR SPINE - COMPLETE 4+ VIEW

[t l-spine a.p.]
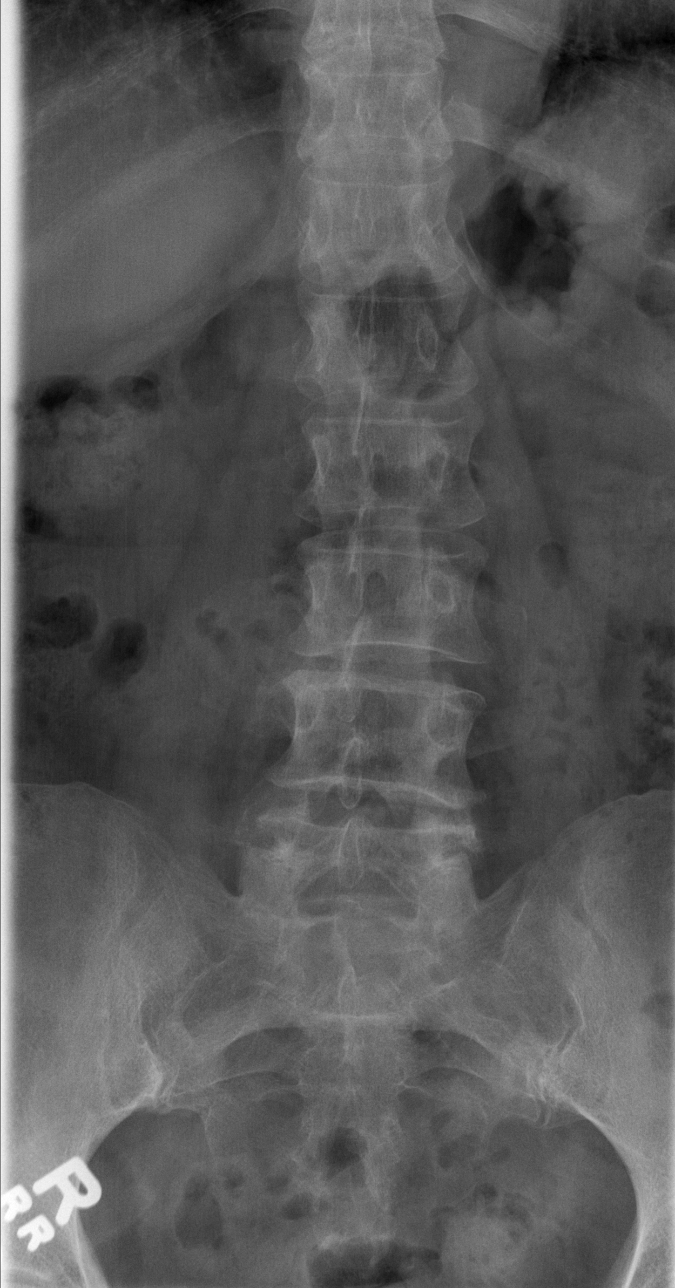

[t l-spine oblique exposure (1 of 2)]
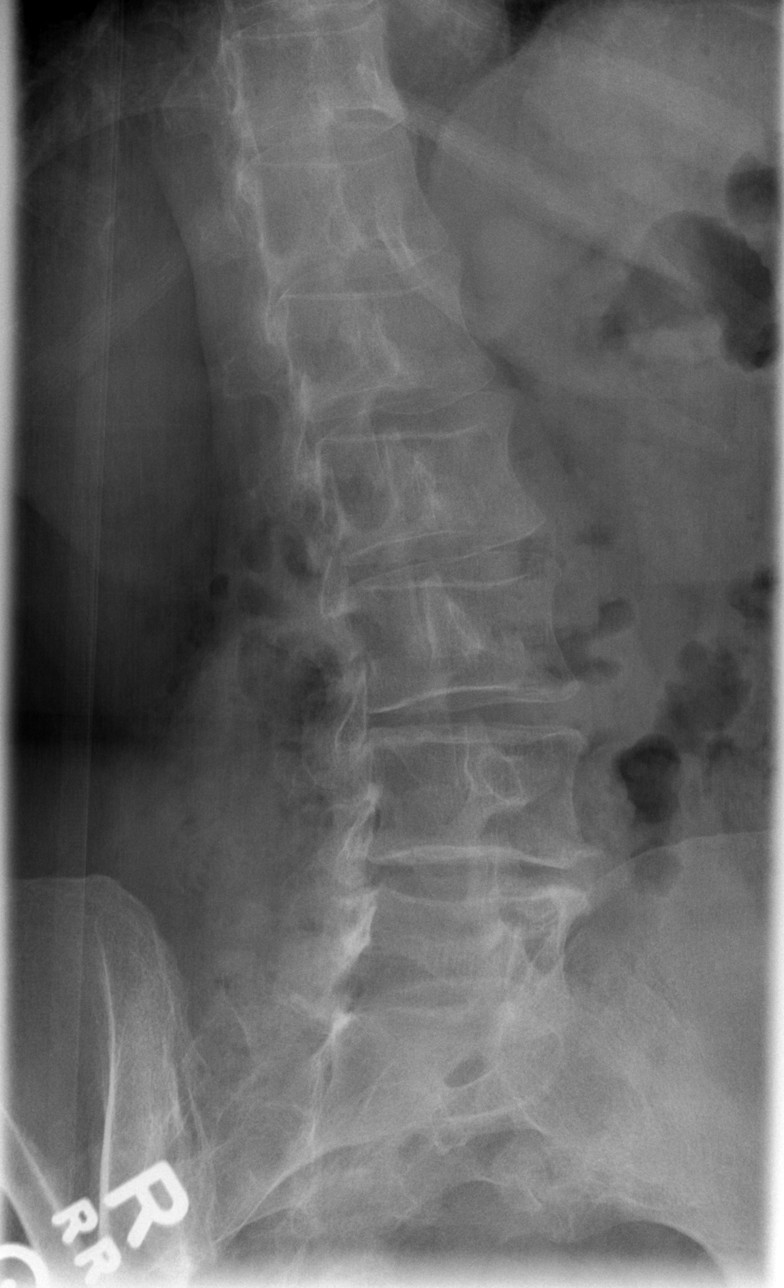

[t l-spine lat]
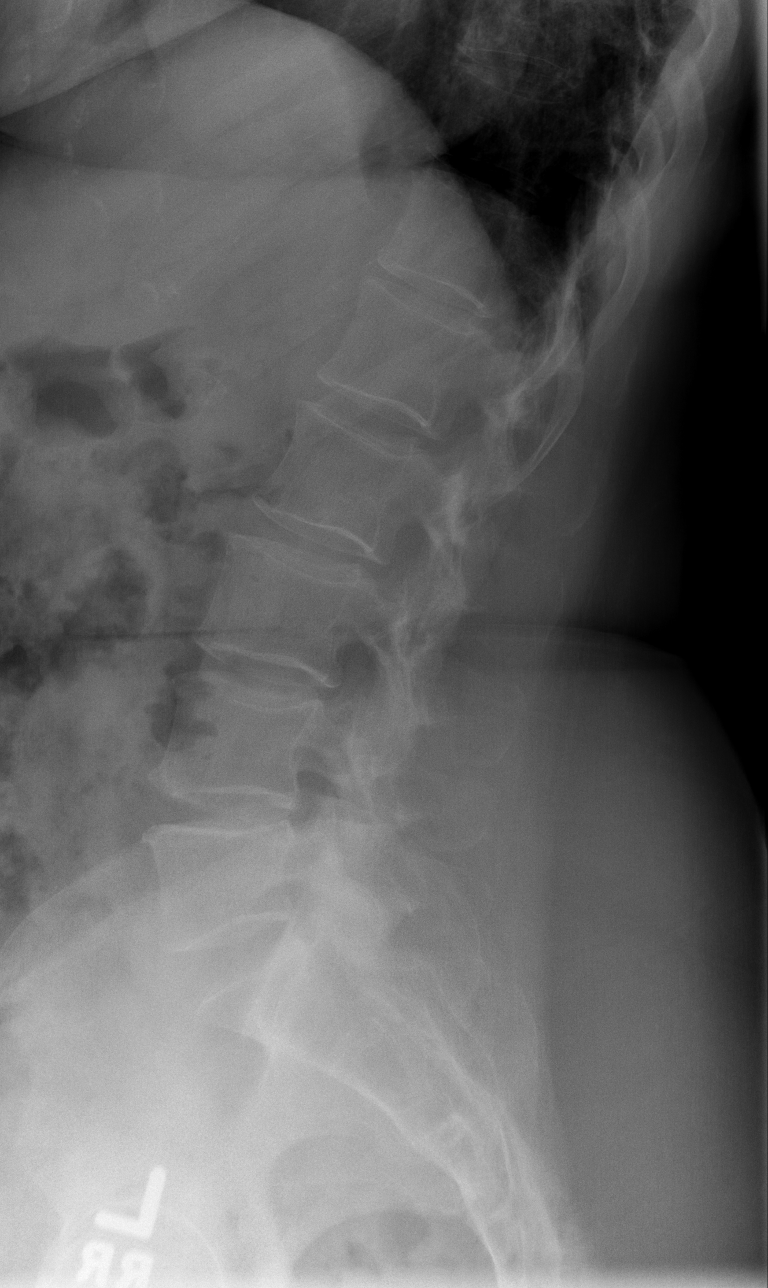

[t l-spine l5-s1 spot]
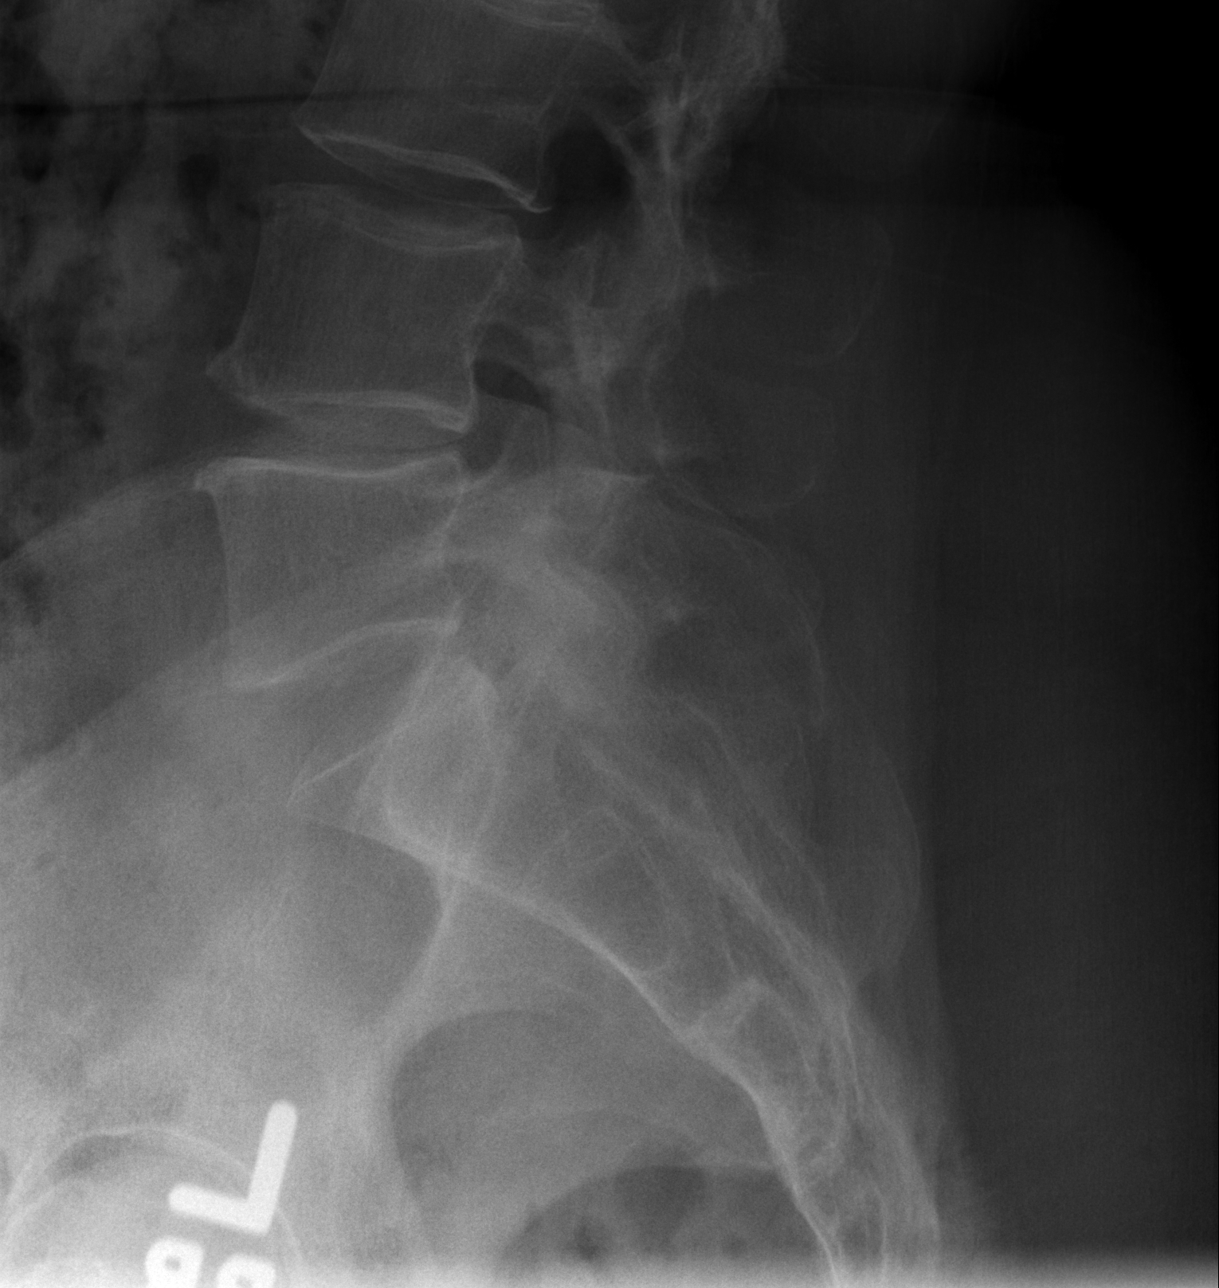

[t l-spine oblique exposure (2 of 2)]
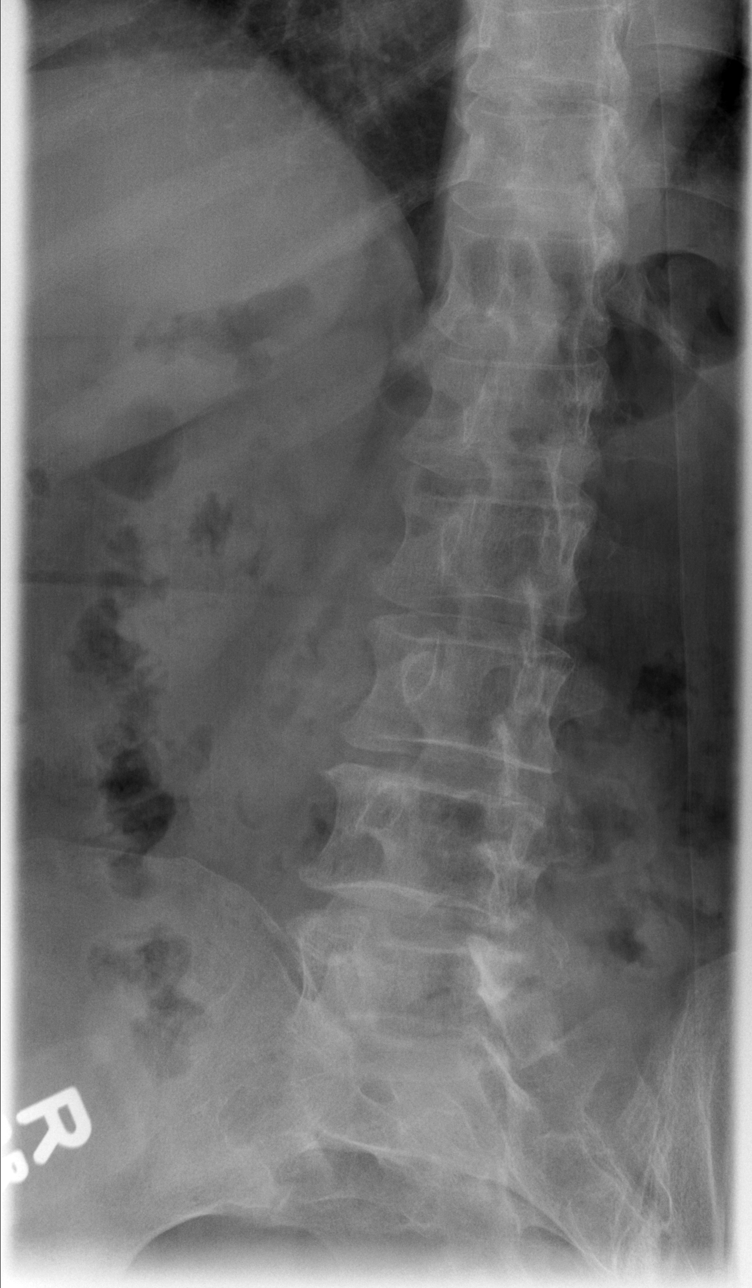

[5 of 5 positions shown; findings below may reference images not displayed]

FINDINGS: Frontal, lateral, spot lumbosacral lateral, and bilateral oblique
views were obtained. The there are 5 non-rib-bearing lumbar type
vertebral bodies. There is levorotoscoliosis in the lumbar region.
There is no fracture or spondylolisthesis. There is mild disc space
narrowing at L1-2, L2-3, L3-4, and L4-5. There is facet
osteoarthritic change at L2-3 at L4-5 bilaterally. No erosive
change.
IMPRESSION: Osteoarthritic changes several levels. Mild scoliosis. No fracture
or spondylolisthesis.

## 2016-11-20 ENCOUNTER — Encounter: Payer: Self-pay | Admitting: Family Medicine

## 2016-11-20 ENCOUNTER — Ambulatory Visit (INDEPENDENT_AMBULATORY_CARE_PROVIDER_SITE_OTHER): Payer: Medicare Other | Admitting: Family Medicine

## 2016-11-20 VITALS — BP 130/74 | HR 48 | Temp 98.3°F | Resp 18 | Wt 184.6 lb

## 2016-11-20 DIAGNOSIS — E782 Mixed hyperlipidemia: Secondary | ICD-10-CM

## 2016-11-20 DIAGNOSIS — I480 Paroxysmal atrial fibrillation: Secondary | ICD-10-CM | POA: Diagnosis not present

## 2016-11-20 DIAGNOSIS — E663 Overweight: Secondary | ICD-10-CM | POA: Diagnosis not present

## 2016-11-20 DIAGNOSIS — I10 Essential (primary) hypertension: Secondary | ICD-10-CM

## 2016-11-20 DIAGNOSIS — D509 Iron deficiency anemia, unspecified: Secondary | ICD-10-CM | POA: Diagnosis not present

## 2016-11-20 DIAGNOSIS — R739 Hyperglycemia, unspecified: Secondary | ICD-10-CM

## 2016-11-20 LAB — CBC
HCT: 34.3 % — ABNORMAL LOW (ref 36.0–46.0)
HEMOGLOBIN: 10.9 g/dL — AB (ref 12.0–15.0)
MCHC: 31.9 g/dL (ref 30.0–36.0)
MCV: 87.3 fl (ref 78.0–100.0)
Platelets: 313 10*3/uL (ref 150.0–400.0)
RBC: 3.93 Mil/uL (ref 3.87–5.11)
RDW: 17.4 % — ABNORMAL HIGH (ref 11.5–15.5)
WBC: 6.2 10*3/uL (ref 4.0–10.5)

## 2016-11-20 LAB — TSH: TSH: 4.14 u[IU]/mL (ref 0.35–4.50)

## 2016-11-20 LAB — HEMOGLOBIN A1C: HEMOGLOBIN A1C: 6 % (ref 4.6–6.5)

## 2016-11-20 NOTE — Assessment & Plan Note (Signed)
Encouraged heart healthy diet, increase exercise, avoid trans fats, consider a krill oil cap daily 

## 2016-11-20 NOTE — Assessment & Plan Note (Addendum)
Well controlled, no changes to meds. Encouraged heart healthy diet such as the DASH diet and exercise as tolerated. If pulse below 50 and/or BP<110/80

## 2016-11-20 NOTE — Patient Instructions (Addendum)
MIND diet BRAT (bananas, rice, applesauce or toast) 24 hours clear fluids [facility] [place] [facility] to find a new doc  DASH Eating Plan [place] stands for "Dietary Approaches to Stop Hypertension." The DASH eating plan is a healthy eating plan that has been shown to reduce high blood pressure (hypertension). It may also reduce your risk for type 2 diabetes, heart disease, and stroke. The DASH eating plan may also help with weight loss. What are tips for following this plan? General guidelines  Avoid eating more than 2,300 mg (milligrams) of salt (sodium) a day. If you have hypertension, you may need to reduce your sodium intake to 1,500 mg a day.  Limit alcohol intake to no more than 1 drink a day for nonpregnant women and 2 drinks a day for men. One drink equals 12 oz of beer, 5 oz of wine, or 1 oz of hard liquor.  Work with your health care provider to maintain a healthy body weight or to lose weight. Ask what an ideal weight is for you.  Get at least 30 minutes of exercise that causes your heart to beat faster (aerobic exercise) most days of the week. Activities may include walking, swimming, or biking.  Work with your health care provider or diet and nutrition specialist (dietitian) to adjust your eating plan to your individual calorie needs. Reading food labels  Check food labels for the amount of sodium per serving. Choose foods with less than 5 percent of the Daily Value of sodium. Generally, foods with less than 300 mg of sodium per serving fit into this eating plan.  To find whole grains, look for the word "whole" as the first word in the ingredient list. Shopping  Buy products labeled as "low-sodium" or "no salt added."  Buy fresh foods. Avoid canned foods and premade or frozen meals. Cooking  Avoid adding salt when cooking. Use salt-free seasonings or herbs instead of table salt or sea salt. Check with your health care provider or pharmacist before  using salt substitutes.  Do not fry foods. Cook foods using healthy methods such as baking, boiling, grilling, and broiling instead.  Cook with heart-healthy oils, such as olive, canola, soybean, or sunflower oil. Meal planning   Eat a balanced diet that includes: ? 5 or more servings of fruits and vegetables each day. At each meal, try to fill half of your plate with fruits and vegetables. ? Up to 6-8 servings of whole grains each day. ? Less than 6 oz of lean meat, poultry, or fish each day. A 3-oz serving of meat is about the same size as a deck of cards. One egg equals 1 oz. ? 2 servings of low-fat dairy each day. ? A serving of nuts, seeds, or beans 5 times each week. ? Heart-healthy fats. Healthy fats called Omega-3 fatty acids are found in foods such as flaxseeds and coldwater fish, like sardines, salmon, and mackerel.  Limit how much you eat of the following: ? Canned or prepackaged foods. ? Food that is high in trans fat, such as fried foods. ? Food that is high in saturated fat, such as fatty meat. ? Sweets, desserts, sugary drinks, and other foods with added sugar. ? Full-fat dairy products.  Do not salt foods before eating.  Try to eat at least 2 vegetarian meals each week.  Eat more home-cooked food and less restaurant, buffet, and fast food.  When eating at a restaurant, ask that your food be prepared with less  salt or no salt, if possible. What foods are recommended? The items listed may not be a complete list. Talk with your dietitian about what dietary choices are best for you. Grains Whole-grain or whole-wheat bread. Whole-grain or whole-wheat pasta. Brown rice. [place]. Bulgur. Whole-grain and low-sodium cereals. Pita bread. Low-fat, low-sodium crackers. Whole-wheat flour tortillas. Vegetables Fresh or frozen vegetables (raw, steamed, roasted, or grilled). Low-sodium or reduced-sodium tomato and vegetable juice. Low-sodium or reduced-sodium tomato sauce  and tomato paste. Low-sodium or reduced-sodium canned vegetables. Fruits All fresh, dried, or frozen fruit. Canned fruit in natural juice (without added sugar). Meat and other protein foods Skinless chicken or [place]. Ground chicken or [place]. Pork with fat trimmed off. Fish and seafood. Egg whites. Dried beans, peas, or lentils. Unsalted nuts, nut butters, and seeds. Unsalted canned beans. Lean cuts of beef with fat trimmed off. Low-sodium, lean deli meat. Dairy Low-fat (1%) or fat-free (skim) milk. Fat-free, low-fat, or reduced-fat cheeses. Nonfat, low-sodium ricotta or cottage cheese. Low-fat or nonfat yogurt. Low-fat, low-sodium cheese. Fats and oils Soft margarine without trans fats. Vegetable oil. Low-fat, reduced-fat, or light mayonnaise and salad dressings (reduced-sodium). Canola, safflower, olive, soybean, and sunflower oils. Avocado. Seasoning and other foods Herbs. Spices. Seasoning mixes without salt. Unsalted popcorn and pretzels. Fat-free sweets. What foods are not recommended? The items listed may not be a complete list. Talk with your dietitian about what dietary choices are best for you. Grains Baked goods made with fat, such as croissants, muffins, or some breads. Dry pasta or rice meal packs. Vegetables Creamed or fried vegetables. Vegetables in a cheese sauce. Regular canned vegetables (not low-sodium or reduced-sodium). Regular canned tomato sauce and paste (not low-sodium or reduced-sodium). Regular tomato and vegetable juice (not low-sodium or reduced-sodium). Angie Fava. Olives. Fruits Canned fruit in a light or heavy syrup. Fried fruit. Fruit in cream or butter sauce. Meat and other protein foods Fatty cuts of meat. Ribs. Fried meat. Berniece Salines. Sausage. Bologna and other processed lunch meats. Salami. Fatback. Hotdogs. Bratwurst. Salted nuts and seeds. Canned beans with added salt. Canned or smoked fish. Whole eggs or egg yolks. Chicken or [place] with skin. Dairy Whole or 2%  milk, cream, and half-and-half. Whole or full-fat cream cheese. Whole-fat or sweetened yogurt. Full-fat cheese. Nondairy creamers. Whipped toppings. Processed cheese and cheese spreads. Fats and oils Butter. Stick margarine. Lard. Shortening. Ghee. Bacon fat. Tropical oils, such as coconut, palm kernel, or palm oil. Seasoning and other foods Salted popcorn and pretzels. Onion salt, garlic salt, seasoned salt, table salt, and sea salt. Worcestershire sauce. Tartar sauce. Barbecue sauce. Teriyaki sauce. Soy sauce, including reduced-sodium. Steak sauce. Canned and packaged gravies. Fish sauce. Oyster sauce. Cocktail sauce. Horseradish that you find on the shelf. Ketchup. Mustard. Meat flavorings and tenderizers. Bouillon cubes. Hot sauce and Tabasco sauce. Premade or packaged marinades. Premade or packaged taco seasonings. Relishes. Regular salad dressings. Where to find more information:  National Heart, Lung, and [place]: https://wilson-eaton.com/  American Heart Association: www.heart.org Summary  The DASH eating plan is a healthy eating plan that has been shown to reduce high blood pressure (hypertension). It may also reduce your risk for type 2 diabetes, heart disease, and stroke.  With the DASH eating plan, you should limit salt (sodium) intake to 2,300 mg a day. If you have hypertension, you may need to reduce your sodium intake to 1,500 mg a day.  When on the DASH eating plan, aim to eat more fresh fruits and vegetables, whole grains, lean proteins, low-fat  dairy, and heart-healthy fats.  Work with your health care provider or diet and nutrition specialist (dietitian) to adjust your eating plan to your individual calorie needs. This information is not intended to replace advice given to you by your health care provider. Make sure you discuss any questions you have with your health care provider. Document Released: 04/12/2011 Document Revised: 04/16/2016 Document Reviewed:  04/16/2016 Elsevier Interactive Patient Education  2017 [employer].

## 2016-11-20 NOTE — Assessment & Plan Note (Addendum)
Encouraged DASH diet, decrease po intake and increase exercise as tolerated. Needs 7-8 hours of sleep nightly. Avoid trans fats, eat small, frequent meals every 4-5 hours with lean proteins, complex carbs and healthy fats. Minimize simple carbs, has been exercising regularly and made dietary changes and she has lost weight since her last visit

## 2016-11-20 NOTE — Assessment & Plan Note (Signed)
Increase leafy greens, consider increased lean red meat and using cast iron cookware. Continue to monitor, report any concerns 

## 2016-11-20 NOTE — Assessment & Plan Note (Signed)
Tolerating Xarelto and rate controlled 

## 2016-11-20 NOTE — Progress Notes (Signed)
Subjective:  I acted as a Education administrator for Dr. Charlett Blake. [place], [place]  Patient ID: Autumn Moss, female    DOB: 1941-04-02, 76 y.o.   MRN: [id number]  No chief complaint on file.   HPI  Patient is in today for a 6 month follow up. Patient states she has no acute concerns today. Her blood pressure is elevated today however she denies palpitations, headaches. She is exercising most days of the week and has cut out carbohydrates and feels much better. Her Metoprolol XR 25 mg is now down to half tab daily. Her bp at home is systolic 258N and 277O.   Patient Care Team: Mosie Lukes, MD as PCP - General   Past Medical History:  Diagnosis Date  . Anemia 02/14/2015  . Aortic valve disease    mild - 07/05/05 per echocardiogram   . Benign essential HTN 04/26/2014  . Bradycardia, sinus   . Cataract 06/28/2013   b/l  . Chronic anticoagulation    short course of Xarelto  . COPD (chronic obstructive pulmonary disease) ([place])   . Dyspepsia    occasional  . Ectopic pregnancy    history of x2  . History of echocardiogram 01/26/2009   Est. EF of 55-60%  --  MILD MITRAL REGURGITATION  -  SINCE LAST ECHO OF 07/10/05 NO SIGNIFICANT CHANGES.  ---  [name], MD  . Hyperlipidemia   . Hypertension   . Left hip pain 05/18/2016  . Left ventricular diastolic dysfunction    mild - 07/05/05 per echocardiogram  . Low back pain 04/26/2014  . Macular degeneration 06/28/2013  . Medicare annual wellness visit, subsequent 12/27/2013  . 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 Palpitations    sporadic  . Paroxysmal atrial fibrillation (HCC)   . Preventative health care 05/27/2016  . Sinusitis acute 04/10/2011  . TIA (transient ischemic attack) 01/2012   back on Xarelto    Past Surgical History:  Procedure Laterality Date  . APPENDECTOMY    . ECTOPIC PREGNANCY SURGERY     bilateral, both tubes excised and 1 ovary excised  . OOPHORECTOMY      Family History  Problem Relation Age of Onset  . Kidney  failure Mother   . Hypertension Mother   . Stroke Mother   . Arthritis Mother        RA  . Cancer Father        bladder cancer  . Hypertension Brother   . Arthritis Brother   . Thyroid disease Brother        thyroid growth  . Breast cancer Sister   . Scleroderma Daughter   . Sleep apnea Brother   . Heart attack Neg Hx     Social History   Social History  . Marital status: Married    Spouse name: Josph Macho  . Number of children: 1  . Years of education: N/A   Occupational History  .  Retired   Social History Main Topics  . Smoking status: Former Smoker    Packs/day: 0.50    Years: 25.00    Types: Cigarettes    Quit date: 06/22/2005  . Smokeless tobacco: Never Used  . Alcohol use No  . Drug use: No  . Sexual activity: Not Currently   Other Topics Concern  . Not on file   Social History Narrative   Uses seat belt   Worked at [employer], Retired, moved to [place] [address]   Regular exercise: yes, [facility] and [facility]   Diet, no restrictions, minimizes dairy    Outpatient Medications Prior to Visit  Medication Sig Dispense Refill  . Cholecalciferol (VITAMIN D-3) 1000 UNITS CAPS Take 1 capsule by mouth daily.     . flecainide (TAMBOCOR) 50 MG tablet Take 1 tablet by mouth daily in the morning and take 2 tablets by mouth daily in the evening 270 tablet 3  . Lutein 20 MG CAPS Take 1 capsule by mouth daily.    . metoprolol succinate (TOPROL-XL) 25 MG 24 hr tablet Take 0.5 tablets (12.5 mg total) by mouth at bedtime. 45 tablet 3  . multivitamin (THERAGRAN) per tablet Take 1 tablet by mouth daily.      Marland Kitchen omeprazole (PRILOSEC) 20 MG capsule Take 1 capsule (20 mg total) by mouth 2 (two) times daily. 180 capsule 3  . Probiotic Product (PROBIOTIC DAILY PO) Take 1 capsule by mouth daily.     . rivaroxaban (XARELTO) 20 MG TABS tablet TAKE 1 TABLET BY MOUTH DAILY WITH SUPPER 30 tablet 11  . simvastatin (ZOCOR) 20 MG tablet Take 1 tablet (20 mg total) by mouth daily. 90  tablet 3   No facility-administered medications prior to visit.     Allergies  Allergen Reactions  . Losartan     Ineffective, fatigue, racing heart, sob    Review of Systems  Constitutional: Negative for fever and malaise/fatigue.  HENT: Negative for congestion.   Eyes: Negative for blurred vision.  Respiratory: Negative for cough and shortness of breath.   Cardiovascular: Negative for chest pain, palpitations and leg swelling.  Gastrointestinal: Negative for vomiting.  Musculoskeletal: Negative for back pain.  Skin: Negative for rash.  Neurological: Negative for loss of consciousness and headaches.       Objective:    Physical Exam  Constitutional: She is oriented to person, place, and time. She appears well-developed and well-nourished. No distress.  HENT:  Head: Normocephalic and atraumatic.  Eyes: Conjunctivae are normal.  Neck: Normal range of motion. No thyromegaly present.  Cardiovascular: Normal rate and regular rhythm.   Pulmonary/Chest: Effort normal and breath sounds normal. She has no wheezes.  Abdominal: Soft. Bowel sounds are normal. There is no tenderness.  Musculoskeletal: Normal range of motion. She exhibits no edema or deformity.  Neurological: She is alert and oriented to person, place, and time.  Skin: Skin is warm and dry. She is not diaphoretic.  Psychiatric: She has a normal mood and affect.    BP 130/74   Pulse (!) 48   Temp 98.3 F (36.8 C) (Oral)   Resp 18   Wt 184 lb 9.6 oz (83.7 kg)   SpO2 98%   BMI 30.72 kg/m  Wt Readings from Last 3 Encounters:  11/20/16 184 lb 9.6 oz (83.7 kg)  08/23/16 191 lb 9.6 oz (86.9 kg)  05/18/16 187 lb 6.4 oz (85 kg)   BP Readings from Last 3 Encounters:  11/20/16 130/74  08/23/16 (!) 144/76  05/27/16 140/90     Immunization History  Administered Date(s) Administered  . Influenza Split 03/07/2012  . Influenza Whole 02/04/2009  . Influenza-Unspecified 02/04/2014, 02/02/2015, 02/14/2016  .  Pneumococcal Conjugate-13 08/22/2015  . Pneumococcal Polysaccharide-23 05/08/2007  . Td 05/08/2007  . Tetanus 12/24/2013    Health Maintenance  Topic Date Due  . DEXA SCAN  06/20/2003  . INFLUENZA VACCINE  12/05/2016  . TETANUS/TDAP  12/25/2023  . PNA vac Low Risk Adult  Completed    Lab Results  Component  Value Date   WBC 5.9 08/23/2016   HGB 10.9 (L) 08/23/2016   HCT 35.0 08/23/2016   PLT 361 08/23/2016   GLUCOSE 85 08/23/2016   CHOL 162 08/23/2016   TRIG 60 08/23/2016   HDL 75 08/23/2016   LDLDIRECT 156.6 10/17/2010   LDLCALC 75 08/23/2016   ALT 11 08/23/2016   AST 17 08/23/2016   NA 142 08/23/2016   K 4.4 08/23/2016   CL 107 08/23/2016   CREATININE 0.95 (H) 08/23/2016   BUN 17 08/23/2016   CO2 27 08/23/2016   TSH 3.61 08/22/2015   HGBA1C 5.6 02/16/2016    Lab Results  Component Value Date   TSH 3.61 08/22/2015   Lab Results  Component Value Date   WBC 5.9 08/23/2016   HGB 10.9 (L) 08/23/2016   HCT 35.0 08/23/2016   MCV 89.3 08/23/2016   PLT 361 08/23/2016   Lab Results  Component Value Date   NA 142 08/23/2016   K 4.4 08/23/2016   CO2 27 08/23/2016   GLUCOSE 85 08/23/2016   BUN 17 08/23/2016   CREATININE 0.95 (H) 08/23/2016   BILITOT 0.4 08/23/2016   ALKPHOS 87 08/23/2016   AST 17 08/23/2016   ALT 11 08/23/2016   PROT 6.4 08/23/2016   ALBUMIN 3.7 08/23/2016   CALCIUM 9.0 08/23/2016   GFR 62.76 05/18/2016   Lab Results  Component Value Date   CHOL 162 08/23/2016   Lab Results  Component Value Date   HDL 75 08/23/2016   Lab Results  Component Value Date   LDLCALC 75 08/23/2016   Lab Results  Component Value Date   TRIG 60 08/23/2016   Lab Results  Component Value Date   CHOLHDL 2.2 08/23/2016   Lab Results  Component Value Date   HGBA1C 5.6 02/16/2016         Assessment & Plan:   Problem List Items Addressed This Visit    Mixed hyperlipidemia    Encouraged heart healthy diet, increase exercise, avoid trans fats,  consider a krill oil cap daily      Atrial fibrillation (HCC)    Tolerating Xarelto and rate controlled      Overweight    Encouraged DASH diet, decrease po intake and increase exercise as tolerated. Needs 7-8 hours of sleep nightly. Avoid trans fats, eat small, frequent meals every 4-5 hours with lean proteins, complex carbs and healthy fats. Minimize simple carbs, has been exercising regularly and made dietary changes and she has lost weight since her last visit      Benign essential HTN    Well controlled, no changes to meds. Encouraged heart healthy diet such as the DASH diet and exercise as tolerated. If pulse below 50 and/or BP<110/80       Relevant Orders   CBC   TSH   Anemia    Increase leafy greens, consider increased lean red meat and using cast iron cookware. Continue to monitor, report any concerns       Other Visit Diagnoses    Hyperglycemia    -  Primary   Relevant Orders   Hemoglobin A1c      I am having Ms. Kindig maintain her multivitamin, Lutein, Vitamin D-3, Probiotic Product (PROBIOTIC DAILY PO), simvastatin, metoprolol succinate, flecainide, omeprazole, and rivaroxaban.  No orders of the defined types were placed in this encounter.   CMA served as Education administrator during this visit. History, Physical and Plan performed by medical provider. Documentation and orders reviewed and attested to.  Erline Levine  Charlett Blake, MD

## 2017-02-20 ENCOUNTER — Ambulatory Visit: Payer: Medicare Other | Admitting: Physician Assistant

## 2017-05-05 ENCOUNTER — Other Ambulatory Visit: Payer: Self-pay | Admitting: Cardiology

## 2017-05-21 ENCOUNTER — Encounter: Payer: Medicare Other | Admitting: Family Medicine

## 2017-06-04 ENCOUNTER — Other Ambulatory Visit: Payer: Self-pay | Admitting: Cardiology
